# Patient Record
Sex: Female | Born: 1980 | Race: Black or African American | Hispanic: No | Marital: Single | Smoking: Current every day smoker
Health system: Southern US, Community
[De-identification: ages and names within clinical notes are randomized; demographics above are authoritative.]

## PROBLEM LIST (undated history)

## (undated) ENCOUNTER — Inpatient Hospital Stay (HOSPITAL_COMMUNITY): Payer: Self-pay

## (undated) DIAGNOSIS — Z789 Other specified health status: Secondary | ICD-10-CM

## (undated) DIAGNOSIS — L0291 Cutaneous abscess, unspecified: Secondary | ICD-10-CM

## (undated) HISTORY — PX: DILATION AND CURETTAGE OF UTERUS: SHX78

---

## 2004-02-20 ENCOUNTER — Emergency Department (HOSPITAL_COMMUNITY): Admission: EM | Admit: 2004-02-20 | Discharge: 2004-02-20 | Payer: Self-pay

## 2004-08-07 ENCOUNTER — Emergency Department (HOSPITAL_COMMUNITY): Admission: EM | Admit: 2004-08-07 | Discharge: 2004-08-07 | Payer: Self-pay | Admitting: Family Medicine

## 2004-11-04 ENCOUNTER — Emergency Department (HOSPITAL_COMMUNITY): Admission: EM | Admit: 2004-11-04 | Discharge: 2004-11-04 | Payer: Self-pay | Admitting: Family Medicine

## 2005-02-27 ENCOUNTER — Emergency Department (HOSPITAL_COMMUNITY): Admission: EM | Admit: 2005-02-27 | Discharge: 2005-02-27 | Payer: Self-pay | Admitting: Family Medicine

## 2006-01-01 ENCOUNTER — Inpatient Hospital Stay (HOSPITAL_COMMUNITY): Admission: AD | Admit: 2006-01-01 | Discharge: 2006-01-01 | Payer: Self-pay | Admitting: Obstetrics & Gynecology

## 2006-02-14 ENCOUNTER — Inpatient Hospital Stay (HOSPITAL_COMMUNITY): Admission: AD | Admit: 2006-02-14 | Discharge: 2006-02-16 | Payer: Self-pay | Admitting: Obstetrics & Gynecology

## 2006-07-19 ENCOUNTER — Emergency Department (HOSPITAL_COMMUNITY): Admission: EM | Admit: 2006-07-19 | Discharge: 2006-07-19 | Payer: Self-pay | Admitting: Emergency Medicine

## 2007-01-19 ENCOUNTER — Inpatient Hospital Stay (HOSPITAL_COMMUNITY): Admission: AD | Admit: 2007-01-19 | Discharge: 2007-01-19 | Payer: Self-pay | Admitting: Obstetrics and Gynecology

## 2007-08-09 ENCOUNTER — Emergency Department (HOSPITAL_COMMUNITY): Admission: EM | Admit: 2007-08-09 | Discharge: 2007-08-10 | Payer: Self-pay | Admitting: Emergency Medicine

## 2008-08-05 ENCOUNTER — Ambulatory Visit: Payer: Self-pay | Admitting: Physician Assistant

## 2008-08-05 ENCOUNTER — Inpatient Hospital Stay (HOSPITAL_COMMUNITY): Admission: AD | Admit: 2008-08-05 | Discharge: 2008-08-05 | Payer: Self-pay | Admitting: Obstetrics & Gynecology

## 2008-08-08 ENCOUNTER — Inpatient Hospital Stay (HOSPITAL_COMMUNITY): Admission: RE | Admit: 2008-08-08 | Discharge: 2008-08-08 | Payer: Self-pay | Admitting: Obstetrics

## 2008-08-10 ENCOUNTER — Inpatient Hospital Stay (HOSPITAL_COMMUNITY): Admission: AD | Admit: 2008-08-10 | Discharge: 2008-08-10 | Payer: Self-pay | Admitting: Obstetrics

## 2009-12-18 ENCOUNTER — Emergency Department (HOSPITAL_COMMUNITY): Admission: EM | Admit: 2009-12-18 | Discharge: 2009-12-18 | Payer: Self-pay | Admitting: Emergency Medicine

## 2012-02-09 ENCOUNTER — Encounter (HOSPITAL_COMMUNITY): Payer: Self-pay | Admitting: *Deleted

## 2012-02-09 ENCOUNTER — Inpatient Hospital Stay (HOSPITAL_COMMUNITY)
Admission: AD | Admit: 2012-02-09 | Discharge: 2012-02-09 | Disposition: A | Discharge status: Home / Self Care | Payer: Medicaid Other | Source: Ambulatory Visit | Attending: Family Medicine | Admitting: Family Medicine

## 2012-02-09 ENCOUNTER — Inpatient Hospital Stay (HOSPITAL_COMMUNITY): Payer: Medicaid Other

## 2012-02-09 DIAGNOSIS — IMO0002 Reserved for concepts with insufficient information to code with codable children: Secondary | ICD-10-CM | POA: Insufficient documentation

## 2012-02-09 DIAGNOSIS — F17200 Nicotine dependence, unspecified, uncomplicated: Secondary | ICD-10-CM | POA: Insufficient documentation

## 2012-02-09 DIAGNOSIS — R109 Unspecified abdominal pain: Secondary | ICD-10-CM | POA: Insufficient documentation

## 2012-02-09 DIAGNOSIS — Z8742 Personal history of other diseases of the female genital tract: Secondary | ICD-10-CM

## 2012-02-09 DIAGNOSIS — O034 Incomplete spontaneous abortion without complication: Secondary | ICD-10-CM

## 2012-02-09 HISTORY — DX: Other specified health status: Z78.9

## 2012-02-09 LAB — CBC
HCT: 34.4 % — ABNORMAL LOW (ref 36.0–46.0)
MCHC: 33.4 g/dL (ref 30.0–36.0)
RBC: 3.62 MIL/uL — ABNORMAL LOW (ref 3.87–5.11)
RDW: 13.3 % (ref 11.5–15.5)
WBC: 12.4 10*3/uL — ABNORMAL HIGH (ref 4.0–10.5)

## 2012-02-09 LAB — HCG, QUANTITATIVE, PREGNANCY: hCG, Beta Chain, Quant, S: 180 m[IU]/mL — ABNORMAL HIGH (ref <5)

## 2012-02-09 LAB — WET PREP, GENITAL: Clue Cells Wet Prep HPF POC: NONE SEEN

## 2012-02-09 LAB — POCT PREGNANCY, URINE: Preg Test, Ur: POSITIVE — AB

## 2012-02-09 MED ORDER — KETOROLAC TROMETHAMINE 60 MG/2ML IM SOLN
60.0000 mg | Freq: Once | INTRAMUSCULAR | Status: AC
  Administered 2012-02-09: 60 mg via INTRAMUSCULAR
  Filled 2012-02-09: qty 2

## 2012-02-09 MED ORDER — PROMETHAZINE HCL 25 MG PO TABS: 25.0000 mg | ORAL_TABLET | Freq: Four times a day (QID) | ORAL | Status: AC | PRN

## 2012-02-09 MED ORDER — MISOPROSTOL 200 MCG PO TABS: 800.0000 ug | ORAL_TABLET | Freq: Once | ORAL | Status: DC

## 2012-02-09 NOTE — Progress Notes (Signed)
Pt states started bleeding at 0930, noted heavy bleeding, went to restroom and passed large clot teacup saucer sized. Is on 3rd pad since 0930. Cramping at present. Had TAB 02/03/2012, states did not bleed more than one day. Notes swelling in feet bilaterally, left foot feels tingling.

## 2012-02-09 NOTE — ED Provider Notes (Signed)
History     No chief complaint on file.  HPI Laurie Morales 30 y.o. Had TAB on 02-03-12 at [facility] in [facility] (Dr. Okey Dupre).  Is taking birth control pills.  Today awakened with large amount of blood in the bed and had a very large clot when she was on the toilet (lunch plate size)  Called the office but no one called back.  Came to MAU as she is concerned about the large amount of bleeding and cramping.  Has a follow up appointment scheduled on Monday.  Client reports she was told she was 14-[redacted] weeks pregnant.  Last normal menses was 10-21-11.  OB History    Grav Para Term Preterm Abortions TAB SAB Ect Mult Living   9 5 5  0 4 2 2  0 0 5      Past Medical History  Diagnosis Date  . No pertinent past medical history     Past Surgical History  Procedure Date  . Dilation and curettage of uterus     Family History  Problem Relation Age of Onset  . Anesthesia problems Neg Hx     History  Substance Use Topics  . Smoking status: Current Everyday Smoker    Types: Cigarettes  . Smokeless tobacco: Not on file  . Alcohol Use: Yes     socially     Allergies: No Known Allergies  Prescriptions prior to admission  Medication Sig Dispense Refill  . doxycycline (MONODOX) 100 MG capsule Take 100 mg by mouth 2 (two) times daily.      Marland Kitchen ibuprofen (ADVIL,MOTRIN) 200 MG tablet Take 200 mg by mouth every 6 (six) hours as needed. pain      . norethindrone-ethinyl estradiol (JUNEL FE,GILDESS FE,LOESTRIN FE) 1-20 MG-MCG tablet Take 1 tablet by mouth daily.        Review of Systems  Constitutional: Negative for fever.  Gastrointestinal: Positive for abdominal pain. Negative for nausea and vomiting.  Genitourinary:       Vaginal bleeding   Physical Exam   Blood pressure 118/84, pulse 92, temperature 98.5 F (36.9 C), temperature source Oral, resp. rate 16, height 5\' 4"  (1.626 m), weight 175 lb 2 oz (79.436 kg), last menstrual period 02/03/2012.  Physical Exam  Nursing note and  vitals reviewed. Constitutional: She is oriented to person, place, and time. She appears well-developed and well-nourished.  HENT:  Head: Normocephalic.  Eyes: EOM are normal.  Neck: Neck supple.  GI: Soft. There is no tenderness. There is no rebound and no guarding.  Genitourinary:       Speculum exam: Vulva - dark red blood noted Vagina - Dark blood, no odor Cervix - No contact bleeding Bimanual exam: Cervix open 1 cm Uterus tender, 6 week size Adnexa non tender, no masses bilaterally GC/Chlam, wet prep done Chaperone present for exam.  Musculoskeletal: Normal range of motion.  Neurological: She is alert and oriented to person, place, and time.  Skin: Skin is warm and dry.  Psychiatric: She has a normal mood and affect.   Toradol 60 mg IM for pain in MAU  MAU Course  Procedures  MDM Clinical Data: Heavy vaginal bleeding and pelvic pain and cramping.  1 week status post therapeutic abortion.  TRANSABDOMINAL AND TRANSVAGINAL ULTRASOUND OF PELVIS  Technique: Both transabdominal and transvaginal ultrasound  examinations of the pelvis were performed. Transabdominal  technique was performed for global imaging of the pelvis including  uterus, ovaries, adnexal regions, and pelvic cul-de-sac.  It was necessary to  proceed with endovaginal exam following the  transabdominal exam to visualize the endometrium and ovaries.  Comparison: None.  Findings:  Uterus: 11.0 x 6.2 x 7.8 cm. No fibroids identified.  Endometrium: A rounded mass-like area of heterogeneous increased  echogenicity is seen, a portion of which contains internal blood  flow. This measures approximately 4.3 x 3.1 by 3.4 cm, and is  consistent with retained products of conception.  Right ovary: Normal appearance/no adnexal mass  Left ovary: Normal appearance/no adnexal mass  Other Findings: No free fluid  IMPRESSION:  1. 4 cm soft tissue mass in the central endometrial cavity,  consistent with retained products of  conception.  2. Normal ovaries. No evidence of adnexal mass or free fluid.   1500 consult with Dr. Shawnie Pons re: exam, labs, ultrasound results and plan of care  Assessment and Plan  Post TAB - probable retained products and heavy bleeding  Plan RX cytotec 800 mcg per rectum single dose RX ibuprofen 600 mg po q6h prn for pain rx phenergan 25 mg po q 4 h prn for nausea Keep appointment as scheduled for TAB follow up Return if having severe bleeding or severe pain.  Laurie Morales 02/09/2012, 12:38 PM   Nolene Bernheim, NP 02/09/12 1512

## 2012-02-09 NOTE — Discharge Instructions (Signed)
Get your prescriptions filled.  Insert the cytotec all 4 tablets at once into the rectum at bedtime.  If you have problems with bleeding or fever, you need to go to [facility] so your doctor can be notified.

## 2012-02-09 NOTE — Progress Notes (Signed)
Had a TAB on 02/03/12.  Was taking birth control pills to regulate bleeding and states bleeding was okay until this morning.  States she woke up in a puddle of blood, states it was running down legs.  Went to bathroom and passed saucer sized clot.  Has continued to pass golf ball sized clots.  Is now on pad #3 since 0930.  Reports cramping since 0930.

## 2012-02-09 NOTE — ED Provider Notes (Signed)
Chart reviewed and agree with management and plan.  

## 2012-07-17 ENCOUNTER — Encounter (HOSPITAL_COMMUNITY): Payer: Self-pay | Admitting: *Deleted

## 2012-07-17 ENCOUNTER — Emergency Department (HOSPITAL_COMMUNITY)
Admission: EM | Admit: 2012-07-17 | Discharge: 2012-07-17 | Disposition: A | Discharge status: Home / Self Care | Payer: Medicaid Other | Attending: Emergency Medicine | Admitting: Emergency Medicine

## 2012-07-17 DIAGNOSIS — F17200 Nicotine dependence, unspecified, uncomplicated: Secondary | ICD-10-CM | POA: Insufficient documentation

## 2012-07-17 DIAGNOSIS — L0390 Cellulitis, unspecified: Secondary | ICD-10-CM

## 2012-07-17 DIAGNOSIS — N61 Mastitis without abscess: Secondary | ICD-10-CM | POA: Insufficient documentation

## 2012-07-17 MED ORDER — DIPHENHYDRAMINE HCL 50 MG/ML IJ SOLN
INTRAMUSCULAR | Status: AC
  Filled 2012-07-17: qty 1

## 2012-07-17 MED ORDER — DOXYCYCLINE HYCLATE 100 MG PO CAPS: 100.0000 mg | ORAL_CAPSULE | Freq: Two times a day (BID) | ORAL | Status: AC

## 2012-07-17 MED ORDER — VANCOMYCIN HCL IN DEXTROSE 1-5 GM/200ML-% IV SOLN
1000.0000 mg | Freq: Once | INTRAVENOUS | Status: AC
  Administered 2012-07-17: 1000 mg via INTRAVENOUS
  Filled 2012-07-17: qty 200

## 2012-07-17 MED ORDER — DIPHENHYDRAMINE HCL 50 MG/ML IJ SOLN
25.0000 mg | Freq: Once | INTRAMUSCULAR | Status: AC
  Administered 2012-07-17: 25 mg via INTRAVENOUS

## 2012-07-17 MED ORDER — HYDROCODONE-ACETAMINOPHEN 5-325 MG PO TABS: 1.0000 | ORAL_TABLET | Freq: Four times a day (QID) | ORAL | Status: AC | PRN

## 2012-07-17 NOTE — ED Notes (Signed)
Spoke with case management re: assistance with Rx, awaiting return call

## 2012-07-17 NOTE — ED Provider Notes (Signed)
History   This chart was scribed for Benny Lennert, MD by Sofie Rower. The patient was seen in room TR09C/TR09C and the patient's care was started at 12:28 PM     CSN: [id number]  Arrival date & time 07/17/12  1046   None     Chief Complaint  Patient presents with  . Abscess    (Consider location/radiation/quality/duration/timing/severity/associated sxs/prior treatment) Patient is a 31 y.o. female presenting with abscess. The history is provided by the patient. No language interpreter was used.  Abscess  This is a new problem. The current episode started more than one week ago. The onset was gradual. The problem occurs rarely. The problem has been gradually worsening. The abscess is present on the torso (right breast). The problem is moderate. The abscess is characterized by swelling and painfulness. It is unknown what she was exposed to. The abscess first occurred at home. Pertinent negatives include no fever, no vomiting and no cough.     Past Medical History  Diagnosis Date  . No pertinent past medical history     Past Surgical History  Procedure Date  . Dilation and curettage of uterus     Family History  Problem Relation Age of Onset  . Anesthesia problems Neg Hx     History  Substance Use Topics  . Smoking status: Current Everyday Smoker    Types: Cigarettes  . Smokeless tobacco: Not on file  . Alcohol Use: Yes     socially     OB History    Grav Para Term Preterm Abortions TAB SAB Ect Mult Living   9 5 5  0 4 2 2  0 0 5      Review of Systems  Constitutional: Negative for fever.  Respiratory: Negative for cough.   Gastrointestinal: Negative for vomiting.  All other systems reviewed and are negative.    Allergies  Review of patient's allergies indicates no known allergies.  Home Medications   Current Outpatient Rx  Name Route Sig Dispense Refill  . ACETAMINOPHEN 500 MG PO TABS Oral Take 750 mg by mouth every 6 (six) hours as needed. As needed  for pain.    . IBUPROFEN 200 MG PO TABS Oral Take 600 mg by mouth every 6 (six) hours as needed. pain      BP 134/87  Pulse 103  Temp 98.1 F (36.7 C) (Oral)  Resp 16  SpO2 99%  Physical Exam  Nursing note and vitals reviewed. Constitutional: She is oriented to person, place, and time. She appears well-developed.  HENT:  Head: Normocephalic.  Eyes: Conjunctivae are normal.  Neck: No tracheal deviation present.  Cardiovascular:  No murmur heard. Musculoskeletal: Normal range of motion.       Abscess located at the right breast, swollen, tender, red around areola, cellulitis outside the areola.   Neurological: She is oriented to person, place, and time.  Skin: Skin is warm.  Psychiatric: She has a normal mood and affect.    ED Course  Procedures (including critical care time)  DIAGNOSTIC STUDIES: Oxygen Saturation is 99% on room air, normal by my interpretation.    COORDINATION OF CARE:    12:35AM- EDP at bedside discusses treatment plan concerning possible I and D procedure.   Labs Reviewed - No data to display No results found.   No diagnosis found.    MDM        The chart was scribed for me under my direct supervision.  I personally performed the history, physical,  and medical decision making and all procedures in the evaluation of this patient.Benny Lennert, MD 07/17/12 1322

## 2012-07-17 NOTE — Progress Notes (Signed)
Consulted by attending nurse to obtain medication assistance for patient. Although she does show Medicaid as active, patient has not received cards for eligibility and cannot afford the antibiotic. Per pharmacy, patient is eligible for the [employer]. I approved the script, picked up the medication and delivered to the attending nurse for her to disburse.

## 2012-07-17 NOTE — ED Notes (Signed)
Pt was provided antibiotic Rx to take home, pt left without receiving her Rx, pt contacted at home & pt is to come to the ED & speak with the nurse first to receive her medication

## 2012-07-17 NOTE — ED Notes (Signed)
Pt with what appears to be abscess to right breast, reports pain, swelling, and warmth to area.

## 2012-07-17 NOTE — ED Notes (Signed)
Rt breast is swollen and red tender to touch states it feel better now after shower. States she stopped breast feeding 6 years ago but milk never dried up till like a month ago

## 2012-07-18 ENCOUNTER — Ambulatory Visit (INDEPENDENT_AMBULATORY_CARE_PROVIDER_SITE_OTHER): Payer: Self-pay | Admitting: Surgery

## 2012-07-18 ENCOUNTER — Encounter (INDEPENDENT_AMBULATORY_CARE_PROVIDER_SITE_OTHER): Payer: Self-pay | Admitting: Surgery

## 2012-07-18 ENCOUNTER — Other Ambulatory Visit (INDEPENDENT_AMBULATORY_CARE_PROVIDER_SITE_OTHER): Payer: Self-pay | Admitting: Surgery

## 2012-07-18 VITALS — BP 114/78 | HR 60 | Temp 97.4°F | Resp 12 | Ht 64.0 in | Wt 170.8 lb

## 2012-07-18 DIAGNOSIS — N611 Abscess of the breast and nipple: Secondary | ICD-10-CM | POA: Insufficient documentation

## 2012-07-18 DIAGNOSIS — Z720 Tobacco use: Secondary | ICD-10-CM

## 2012-07-18 DIAGNOSIS — N61 Mastitis without abscess: Secondary | ICD-10-CM

## 2012-07-18 NOTE — Patient Instructions (Addendum)
WOUND CARE  It is important that the wound be kept open.   -Keeping the skin edges apart will allow the wound to gradually heal from the base upwards.   - If the skin edges of the wound close too early, a new fluid pocket can form and infection can occur. -This is the reason to pack deeper wounds with gauze or ribbon -This is why drained wounds cannot be sewed closed right away  A healthy wound should form a lining of bright red "beefy" granulating tissue that will help shrink the wound and help the edges grow new skin into it.   -A little mucus / yellow discharge is normal (the body's natural way to try and form a scab) and should be gently washed off with soap and water with daily dressing changes.  -Green or foul smelling drainage implies bacterial colonization and can slow wound healing - a short course of antibiotic ointment (3-5 days) can help it clear up.  Call the doctor if it does not improve or worsens  -Avoid use of antibiotic ointments for more than a week as they can slow wound healing over time.    -Sometimes other wound care products will be used to reduce need for dressing changes and/or help clean up dirty wounds -Sometimes the surgeon needs to debride the wound in the office to remove dead or infected tissue out of the wound so it can heal more quickly and safely.    Change the dressing at least once a day -Wash the wound with mild soap and water gently every day.  It is good to shower or bathe the wound to help it clean out. -Use clean 4x4 gauze for medium/large wounds or ribbon plain NU-gauze for smaller wounds (it does not need to be sterile, just clean) -Keep the raw wound moist with a little saline or KY (saline) gel on the gauze.  -A dry wound will take longer to heal.  -Keep the skin dry around the wound to prevent breakdown and irritation. -Pack the wound down to the base -The goal is to keep the skin apart, not overpack the wound -Use a Q-tip or blunt-tipped kabob  stick toothpick to push the gauze down to the base in narrow or deep wounds   -Cover with a clean gauze and tape -paper or Medipore tape tend to be gentle on the skin -rotate the orientation of the tape to avoid repeated stress/trauma on the skin -using an ACE or Coban wrap on wounds on arms or legs can be used instead.  Managing Pain  Pain after surgery or related to activity is often due to strain/injury to muscle, tendon, nerves and/or incisions.  This pain is usually short-term and will improve in a few months.   Many people find it helpful to do the following things TOGETHER to help speed the process of healing and to get back to regular activity more quickly:  1. Avoid heavy physical activity a.  no lifting greater than 20 pounds b. Do not "push through" the pain.  Listen to your body and avoid positions and maneuvers than reproduce the pain c. Walking is okay as tolerated, but go slowly and stop when getting sore.  d. Remember: If it hurts to do it, then don't do it! 2. Take Anti-inflammatory medication  a. Take with food/snack around the clock for 1-2 weeks i. This helps the muscle and nerve tissues become less irritable and calm down faster b. Choose ONE of the following over-the-counter  medications: i. Naproxen 220mg  tabs (ex. Aleve) 1-2 pills twice a day  ii. Ibuprofen 200mg  tabs (ex. Advil, Motrin) 3-4 pills with every meal and just before bedtime iii. Acetaminophen 500mg  tabs (Tylenol) 1-2 pills with every meal and just before bedtime 3. Use a Heating pad or Ice/Cold Pack a. 4-6 times a day b. May use warm bath/hottub  or showers 4. Try Gentle Massage and/or Stretching  a. at the area of pain many times a day b. stop if you feel pain - do not overdo it  Try these steps together to help you body heal faster and avoid making things get worse.  Doing just one of these things may not be enough.    If you are not getting better after two weeks or are noticing you are getting  worse, contact our office for further advice; we may need to re-evaluate you & see what other things we can do to help  Complete all antibiotics through the entire prescription to help the infection heal and prevent new places of infection   Returning the see the surgeon is helpful to follow the healing process and help the wound close as fast as possible.  Managing Pain  Pain after surgery or related to activity is often due to strain/injury to muscle, tendon, nerves and/or incisions.  This pain is usually short-term and will improve in a few months.   Many people find it helpful to do the following things TOGETHER to help speed the process of healing and to get back to regular activity more quickly:  5. Avoid heavy physical activity a.  no lifting greater than 20 pounds b. Do not "push through" the pain.  Listen to your body and avoid positions and maneuvers than reproduce the pain c. Walking is okay as tolerated, but go slowly and stop when getting sore.  d. Remember: If it hurts to do it, then don't do it! 6. Take Anti-inflammatory medication  a. Take with food/snack around the clock for 1-2 weeks i. This helps the muscle and nerve tissues become less irritable and calm down faster b. Choose ONE of the following over-the-counter medications: i. Naproxen 220mg  tabs (ex. Aleve) 1-2 pills twice a day  ii. Ibuprofen 200mg  tabs (ex. Advil, Motrin) 3-4 pills with every meal and just before bedtime iii. Acetaminophen 500mg  tabs (Tylenol) 1-2 pills with every meal and just before bedtime 7. Use a Heating pad or Ice/Cold Pack a. 4-6 times a day b. May use warm bath/hottub  or showers 8. Try Gentle Massage and/or Stretching  a. at the area of pain many times a day b. stop if you feel pain - do not overdo it  Try these steps together to help you body heal faster and avoid making things get worse.  Doing just one of these things may not be enough.    If you are not getting better after two  weeks or are noticing you are getting worse, contact our office for further advice; we may need to re-evaluate you & see what other things we can do to help.  Smoking Cessation This document explains the best ways for you to quit smoking and new treatments to help. It lists new medicines that can double or triple your chances of quitting and quitting for good. It also considers ways to avoid relapses and concerns you may have about quitting, including weight gain. NICOTINE: A POWERFUL ADDICTION If you have tried to quit smoking, you know how hard it can be.  It is hard because nicotine is a very addictive drug. For some people, it can be as addictive as heroin or cocaine. Usually, people make 2 or 3 tries, or more, before finally being able to quit. Each time you try to quit, you can learn about what helps and what hurts. Quitting takes hard work and a lot of effort, but you can quit smoking. QUITTING SMOKING IS ONE OF THE MOST IMPORTANT THINGS YOU WILL EVER DO.  You will live longer, feel better, and live better.   The impact on your body of quitting smoking is felt almost immediately:   Within 20 minutes, blood pressure decreases. Pulse returns to its normal level.   After 8 hours, carbon monoxide levels in the blood return to normal. Oxygen level increases.   After 24 hours, chance of heart attack starts to decrease. Breath, hair, and body stop smelling like smoke.   After 48 hours, damaged nerve endings begin to recover. Sense of taste and smell improve.   After 72 hours, the body is virtually free of nicotine. Bronchial tubes relax and breathing becomes easier.   After 2 to 12 weeks, lungs can hold more air. Exercise becomes easier and circulation improves.   Quitting will reduce your risk of having a heart attack, stroke, cancer, or lung disease:   After 1 year, the risk of coronary heart disease is cut in half.   After 5 years, the risk of stroke falls to the same as a nonsmoker.     After 10 years, the risk of lung cancer is cut in half and the risk of other cancers decreases significantly.   After 15 years, the risk of coronary heart disease drops, usually to the level of a nonsmoker.   If you are pregnant, quitting smoking will improve your chances of having a healthy baby.   The people you live with, especially your children, will be healthier.   You will have extra money to spend on things other than cigarettes.  FIVE KEYS TO QUITTING Studies have shown that these 5 steps will help you quit smoking and quit for good. You have the best chances of quitting if you use them together: 1. Get ready.  2. Get support and encouragement.  3. Learn new skills and behaviors.  4. Get medicine to reduce your nicotine addiction and use it correctly.  5. Be prepared for relapse or difficult situations. Be determined to continue trying to quit, even if you do not succeed at first.  1. GET READY  Set a quit date.   Change your environment.   Get rid of ALL cigarettes, ashtrays, matches, and lighters in your home, car, and place of work.   Do not let people smoke in your home.   Review your past attempts to quit. Think about what worked and what did not.   Once you quit, do not smoke. NOT EVEN A PUFF!  2. GET SUPPORT AND ENCOURAGEMENT Studies have shown that you have a better chance of being successful if you have help. You can get support in many ways.  Tell your family, friends, and coworkers that you are going to quit and need their support. Ask them not to smoke around you.   Talk to your caregivers (doctor, dentist, nurse, pharmacist, psychologist, and/or smoking counselor).   Get individual, group, or telephone counseling and support. The more counseling you have, the better your chances are of quitting. Programs are available at [employer] and health centers. Call your  local health department for information about programs in your area.   Spiritual beliefs  and practices may help some smokers quit.   Quit meters are Photographer that keep track of quit statistics, such as amount of "quit-time," cigarettes not smoked, and money saved.   Many smokers find one or more of the many self-help books available useful in helping them quit and stay off tobacco.  3. LEARN NEW SKILLS AND BEHAVIORS  Try to distract yourself from urges to smoke. Talk to someone, go for a walk, or occupy your time with a task.   When you first try to quit, change your routine. Take a different route to work. Drink tea instead of coffee. Eat breakfast in a different place.   Do something to reduce your stress. Take a hot bath, exercise, or read a book.   Plan something enjoyable to do every day. Reward yourself for not smoking.   Explore interactive web-based programs that specialize in helping you quit.  4. GET MEDICINE AND USE IT CORRECTLY Medicines can help you stop smoking and decrease the urge to smoke. Combining medicine with the above behavioral methods and support can quadruple your chances of successfully quitting smoking. The [place] Food and Drug Administration (FDA) has approved 7 medicines to help you quit smoking. These medicines fall into 3 categories.  Nicotine replacement therapy (delivers nicotine to your body without the negative effects and risks of smoking):   Nicotine gum: Available over-the-counter.   Nicotine lozenges: Available over-the-counter.   Nicotine inhaler: Available by prescription.   Nicotine nasal spray: Available by prescription.   Nicotine skin patches (transdermal): Available by prescription and over-the-counter.   Antidepressant medicine (helps people abstain from smoking, but how this works is unknown):   Bupropion sustained-release (SR) tablets: Available by prescription.   Nicotinic receptor partial agonist (simulates the effect of nicotine in your brain):   Varenicline tartrate tablets:  Available by prescription.   Ask your caregiver for advice about which medicines to use and how to use them. Carefully read the information on the package.   Everyone who is trying to quit may benefit from using a medicine. If you are pregnant or trying to become pregnant, nursing an infant, you are under age 80, or you smoke fewer than 10 cigarettes per day, talk to your caregiver before taking any nicotine replacement medicines.   You should stop using a nicotine replacement product and call your caregiver if you experience nausea, dizziness, weakness, vomiting, fast or irregular heartbeat, mouth problems with the lozenge or gum, or redness or swelling of the skin around the patch that does not go away.   Do not use any other product containing nicotine while using a nicotine replacement product.   Talk to your caregiver before using these products if you have diabetes, heart disease, asthma, stomach ulcers, you had a recent heart attack, you have high blood pressure that is not controlled with medicine, a history of irregular heartbeat, or you have been prescribed medicine to help you quit smoking.  5. BE PREPARED FOR RELAPSE OR DIFFICULT SITUATIONS  Most relapses occur within the first 3 months after quitting. Do not be discouraged if you start smoking again. Remember, most people try several times before they finally quit.   You may have symptoms of withdrawal because your body is used to nicotine. You may crave cigarettes, be irritable, feel very hungry, cough often, get headaches, or have difficulty concentrating.   The withdrawal symptoms  are only temporary. They are strongest when you first quit, but they will go away within 10 to 14 days.  Here are some difficult situations to watch for:  Alcohol. Avoid drinking alcohol. Drinking lowers your chances of successfully quitting.   Caffeine. Try to reduce the amount of caffeine you consume. It also lowers your chances of successfully  quitting.   Other smokers. Being around smoking can make you want to smoke. Avoid smokers.   Weight gain. Many smokers will gain weight when they quit, usually less than 10 pounds. Eat a healthy diet and stay active. Do not let weight gain distract you from your main goal, quitting smoking. Some medicines that help you quit smoking may also help delay weight gain. You can always lose the weight gained after you quit.   Bad mood or depression. There are a lot of ways to improve your mood other than smoking.  If you are having problems with any of these situations, talk to your caregiver. SPECIAL SITUATIONS AND CONDITIONS Studies suggest that everyone can quit smoking. Your situation or condition can give you a special reason to quit.  Pregnant women/new mothers: By quitting, you protect your baby's health and your own.   Hospitalized patients: By quitting, you reduce health problems and help healing.   Heart attack patients: By quitting, you reduce your risk of a second heart attack.   Lung, head, and neck cancer patients: By quitting, you reduce your chance of a second cancer.   Parents of children and adolescents: By quitting, you protect your children from illnesses caused by secondhand smoke.  QUESTIONS TO THINK ABOUT Think about the following questions before you try to stop smoking. You may want to talk about your answers with your caregiver.  Why do you want to quit?   If you tried to quit in the past, what helped and what did not?   What will be the most difficult situations for you after you quit? How will you plan to handle them?   Who can help you through the tough times? Your family? Friends? Caregiver?   What pleasures do you get from smoking? What ways can you still get pleasure if you quit?  Here are some questions to ask your caregiver:  How can you help me to be successful at quitting?   What medicine do you think would be best for me and how should I take it?    What should I do if I need more help?   What is smoking withdrawal like? How can I get information on withdrawal?  Quitting takes hard work and a lot of effort, but you can quit smoking. FOR MORE INFORMATION  Smokefree.gov (http://www.davis-sullivan.com/) provides free, accurate, evidence-based information and professional assistance to help support the immediate and long-term needs of people trying to quit smoking. Document Released: 11/30/2001 Document Revised: 11/25/2011 Document Reviewed: 09/22/2009 [facility] Patient Information 2012 [place], [place].

## 2012-07-18 NOTE — Progress Notes (Signed)
Subjective:     Patient ID: Laurie Morales, female   DOB: [birth year], 31 y.o.   MRN: [id number]  HPI  Laurie Morales  February 17, 1981 [id number]  Patient has no care team.  This patient is a 31 y.o.female who presents today for surgical evaluation at the request of Dr. Aileen Pilot, [facility].   Reason for visit: Painful right breast, probable abscess.  Patient is a pleasant smoking female.  She noted right breast pain and tenderness  Five days ago.  It has progressively worsened.  She has tried to do heat and pressure to help him express.  She had a milk duct that was inflamed about six years ago.  Better with compresses and pressure.  This time it is not getting better.  She is concerned.  She went to the emergency room.  She has no insurance nor any money.  I recommended antibiotics and followup with surgery.  She comes in today for evaluation.  She comes today with her mother.  She recalls having an abscess drained around her buttock in the past.  It does not recall history of MRSA.  Patient Active Problem List  Diagnosis  . Abscess of right breast  . Tobacco abuse    Past Medical History  Diagnosis Date  . No pertinent past medical history     Past Surgical History  Procedure Date  . Dilation and curettage of uterus     History   Social History  . Marital Status: Single    Spouse Name: N/A    Number of Children: N/A  . Years of Education: N/A   Occupational History  . Not on file.   Social History Main Topics  . Smoking status: Current Everyday Smoker -- 0.3 packs/day    Types: Cigarettes  . Smokeless tobacco: Not on file  . Alcohol Use: Yes     socially   . Drug Use: No  . Sexually Active: Yes    Birth Control/ Protection: None     currently on bcp s/p TAB   Other Topics Concern  . Not on file   Social History Narrative  . No narrative on file    Family History  Problem Relation Age of Onset  . Anesthesia problems Neg Hx   . Diabetes Mother     . Asthma Daughter   . Diabetes Maternal Grandmother   . Stroke Maternal Grandmother   . Heart disease Maternal Grandmother   . Hearing loss Maternal Grandmother     Current Outpatient Prescriptions  Medication Sig Dispense Refill  . acetaminophen (TYLENOL) 500 MG tablet Take 750 mg by mouth every 6 (six) hours as needed. As needed for pain.      Marland Kitchen doxycycline (VIBRAMYCIN) 100 MG capsule Take 1 capsule (100 mg total) by mouth 2 (two) times daily.  20 capsule  0  . HYDROcodone-acetaminophen (NORCO/VICODIN) 5-325 MG per tablet Take 1 tablet by mouth every 6 (six) hours as needed for pain.  20 tablet  0  . ibuprofen (ADVIL,MOTRIN) 200 MG tablet Take 600 mg by mouth every 6 (six) hours as needed. pain       No current facility-administered medications for this visit.     No Known Allergies  BP 114/78  Pulse 60  Temp 97.4 F (36.3 C) (Temporal)  Resp 12  Ht 5\' 4"  (1.626 m)  Wt 170 lb 12.8 oz (77.474 kg)  BMI 29.32 kg/m2  LMP 06/18/2012  No results found.   Review of Systems  Constitutional: Negative for fever, chills and diaphoresis.  HENT: Negative for ear pain, sore throat and trouble swallowing.   Eyes: Negative for photophobia and visual disturbance.  Respiratory: Negative for cough and choking.   Cardiovascular: Negative for chest pain and palpitations.  Gastrointestinal: Negative for nausea, vomiting, abdominal pain, diarrhea, constipation, anal bleeding and rectal pain.  Genitourinary: Negative for dysuria, frequency and difficulty urinating.  Musculoskeletal: Negative for myalgias and gait problem.  Skin: Negative for color change, pallor and rash.  Neurological: Negative for dizziness, speech difficulty, weakness and numbness.  Hematological: Negative for adenopathy.  Psychiatric/Behavioral: Negative for confusion and agitation. The patient is not nervous/anxious.        Objective:   Physical Exam  Constitutional: She is oriented to person, place, and time. She  appears well-developed and well-nourished. No distress.  HENT:  Head: Normocephalic.  Mouth/Throat: Oropharynx is clear and moist. No oropharyngeal exudate.  Eyes: Conjunctivae and EOM are normal. Pupils are equal, round, and reactive to light. No scleral icterus.  Neck: Normal range of motion. No tracheal deviation present.  Cardiovascular: Normal rate and intact distal pulses.   Pulmonary/Chest: Effort normal. No respiratory distress. She exhibits no tenderness. Right breast exhibits tenderness. Right breast exhibits no inverted nipple and no nipple discharge. Left breast exhibits no inverted nipple, no mass, no nipple discharge, no skin change and no tenderness. Breasts are asymmetrical.    Abdominal: Soft. She exhibits no distension. There is no tenderness. Hernia confirmed negative in the right inguinal area and confirmed negative in the left inguinal area.  Genitourinary: No vaginal discharge found.  Musculoskeletal: Normal range of motion. She exhibits no tenderness.  Lymphadenopathy:       Right: No inguinal adenopathy present.       Left: No inguinal adenopathy present.  Neurological: She is alert and oriented to person, place, and time. No cranial nerve deficit. She exhibits normal muscle tone. Coordination normal.  Skin: Skin is warm and dry. No rash noted. She is not diaphoretic.  Psychiatric: She has a normal mood and affect. Her behavior is normal.       Assessment:     Right breast cellulitis w prob central abscess    Plan:     I discussed with the patient and her mother several options.  At the very least, she needs to stop smoking.  I recommended warm compresses over the breast.  Anti-inflammatory such as ibuprofen around the clock as well.  They have a prescription for Vicodin but did not know if they could afford it.  I noted I do not have any samples.  Because of the marked erythema and and probable fluctuance, I recommended incision and drainage.  She  agreed:  The pathophysiology of subcutaneous abscess and differential diagnosis was discussed.  Natural history progression was discussed.  The patient's symptoms are not adequately controlled.  Non-operative treatment has not healed the abscess.  Therefore, I recommended incision & drainage of the abscess to allow the infection to resolve and heal.  Technique, risks, benefits, alternatives discussed.  The patient expressed understanding & wished to proceed.  I placed a field block with local anaesthetic.  I used an 18-gauge needle and was able to I aspirated several cc of frank pus on the upper inner aspect of the area looked.  I made a curvilinear incision around the area lower border in the upper inner quadrant x15 mm.  I encountered a deeper pocket that went towards the nipple.  I evacuated some coagulum &  pus.  I packed it with quarter inch iodoform gauze.  She tolerated procedure relatively well.I incised the skin over the abscess to release the infection.  I excised skin at the wound to have an adequate opening for drainage & prevent skin reclosure.  I packed the wound with ribbon NU-Gauze.    The patient tolerated the procedure.    We will have the patient return to clinic for close follow up to make sure the infection heals.

## 2012-07-21 ENCOUNTER — Ambulatory Visit (INDEPENDENT_AMBULATORY_CARE_PROVIDER_SITE_OTHER): Payer: Self-pay | Admitting: General Surgery

## 2012-07-21 DIAGNOSIS — Z4800 Encounter for change or removal of nonsurgical wound dressing: Secondary | ICD-10-CM

## 2012-07-21 LAB — WOUND CULTURE

## 2012-07-21 NOTE — Progress Notes (Signed)
Patient comes in status post I and D of right breast abscess. Area was packing with 1/4 inch iodoform and covered with dry gauze. I unpacked wound. It looks good. No odor or excessive drainage. Area was cleaned. She did have a small amount of skin breakdown right at the incision site. She was instructed to change outside gauze often to prevent any moistened gauze from staying on her skin too long. She did have some redness on breast but this area is smaller per patient than yesterday. She has been marking her skin with a pen. Area was repacked with 1/4 inch iodoform and recovered with dry gauze. Patient instructed to leave packing in place per Dr Michaell Cowing and change outside dressing as needed. She will call if the redness on her skin gets worse and keep appt next week.

## 2012-07-21 NOTE — Patient Instructions (Addendum)
Keep area packed until your appt next week. You can change the outside dressing. Keep an eye on the redness. If it gets worse call us. [phone].

## 2012-07-25 ENCOUNTER — Ambulatory Visit (INDEPENDENT_AMBULATORY_CARE_PROVIDER_SITE_OTHER): Payer: Self-pay | Admitting: General Surgery

## 2012-07-25 ENCOUNTER — Encounter (INDEPENDENT_AMBULATORY_CARE_PROVIDER_SITE_OTHER): Payer: Self-pay | Admitting: General Surgery

## 2012-07-25 VITALS — BP 126/80 | HR 72 | Resp 18 | Ht 63.0 in | Wt 170.0 lb

## 2012-07-25 DIAGNOSIS — N611 Abscess of the breast and nipple: Secondary | ICD-10-CM

## 2012-07-25 NOTE — Progress Notes (Signed)
Subjective:     Patient ID: Laurie Morales, female   DOB: 1981/08/20, 31 y.o.   MRN: [id number]  HPI 58 yof s/p right breast abscess I/D by Dr. Michaell Cowing last week.  She is doing well without complaint.  Feels much better  Review of Systems     Objective:   Physical Exam right breast incision without erythema, open, I removed some debris that looked like sebaceous cyst material, removed packing    Assessment:     Right breast abscess    Plan:     I removed packing, will heal by secondary intention now, will see back in 2 weeks, stop smoking

## 2012-08-11 ENCOUNTER — Encounter (INDEPENDENT_AMBULATORY_CARE_PROVIDER_SITE_OTHER): Payer: Self-pay | Admitting: General Surgery

## 2012-09-18 ENCOUNTER — Encounter (INDEPENDENT_AMBULATORY_CARE_PROVIDER_SITE_OTHER): Payer: Self-pay | Admitting: General Surgery

## 2012-09-21 ENCOUNTER — Encounter (INDEPENDENT_AMBULATORY_CARE_PROVIDER_SITE_OTHER): Payer: Self-pay | Admitting: General Surgery

## 2012-12-20 NOTE — L&D Delivery Note (Signed)
Delivery Note At 5:58 AM a viable female was delivered via Vaginal, Spontaneous Delivery (Presentation: Right Occiput Anterior).  APGAR: 8, 9; weight pending.   Placenta status: Intact, Spontaneous.  Cord: 3 vessels with the following complications: None.  Cord pH: NA  Anesthesia: None  Episiotomy: None Lacerations: None Suture Repair: NA Est. Blood Loss (mL): 300  Mom to postpartum.   Baby to nursery-stable. Placenta to: [facility]  [place], [place] 08/16/2013, 6:47 AM

## 2013-01-15 ENCOUNTER — Inpatient Hospital Stay (HOSPITAL_COMMUNITY): Payer: Medicaid Other

## 2013-01-15 ENCOUNTER — Encounter (HOSPITAL_COMMUNITY): Payer: Self-pay | Admitting: *Deleted

## 2013-01-15 ENCOUNTER — Inpatient Hospital Stay (HOSPITAL_COMMUNITY)
Admission: AD | Admit: 2013-01-15 | Discharge: 2013-01-15 | Disposition: A | Discharge status: Home / Self Care | Payer: Medicaid Other | Source: Ambulatory Visit | Attending: Obstetrics & Gynecology | Admitting: Obstetrics & Gynecology

## 2013-01-15 DIAGNOSIS — A499 Bacterial infection, unspecified: Secondary | ICD-10-CM | POA: Insufficient documentation

## 2013-01-15 DIAGNOSIS — O418X90 Other specified disorders of amniotic fluid and membranes, unspecified trimester, not applicable or unspecified: Secondary | ICD-10-CM

## 2013-01-15 DIAGNOSIS — R109 Unspecified abdominal pain: Secondary | ICD-10-CM | POA: Insufficient documentation

## 2013-01-15 DIAGNOSIS — N760 Acute vaginitis: Secondary | ICD-10-CM | POA: Insufficient documentation

## 2013-01-15 DIAGNOSIS — B9689 Other specified bacterial agents as the cause of diseases classified elsewhere: Secondary | ICD-10-CM | POA: Insufficient documentation

## 2013-01-15 DIAGNOSIS — O468X9 Other antepartum hemorrhage, unspecified trimester: Secondary | ICD-10-CM

## 2013-01-15 DIAGNOSIS — O2390 Unspecified genitourinary tract infection in pregnancy, unspecified trimester: Secondary | ICD-10-CM | POA: Insufficient documentation

## 2013-01-15 DIAGNOSIS — O209 Hemorrhage in early pregnancy, unspecified: Secondary | ICD-10-CM | POA: Insufficient documentation

## 2013-01-15 LAB — URINALYSIS, ROUTINE W REFLEX MICROSCOPIC
Glucose, UA: NEGATIVE mg/dL
Ketones, ur: NEGATIVE mg/dL
pH: 5.5 (ref 5.0–8.0)

## 2013-01-15 LAB — URINE MICROSCOPIC-ADD ON

## 2013-01-15 LAB — POCT PREGNANCY, URINE: Preg Test, Ur: POSITIVE — AB

## 2013-01-15 LAB — CBC
Hemoglobin: 12.8 g/dL (ref 12.0–15.0)
MCH: 32.8 pg (ref 26.0–34.0)
RBC: 3.9 MIL/uL (ref 3.87–5.11)

## 2013-01-15 LAB — HCG, QUANTITATIVE, PREGNANCY: hCG, Beta Chain, Quant, S: 45331 m[IU]/mL — ABNORMAL HIGH (ref <5)

## 2013-01-15 MED ORDER — METRONIDAZOLE 500 MG PO TABS: 500.0000 mg | ORAL_TABLET | Freq: Two times a day (BID) | ORAL | Status: DC

## 2013-01-15 NOTE — MAU Provider Note (Signed)
History     CSN: [id number]  Arrival date and time: 01/15/13 1052   First Provider Initiated Contact with Patient 01/15/13 1150      Chief Complaint  Patient presents with  . Vaginal Bleeding  . Abdominal Pain   HPI  Laurie Morales is a 32 y/o [id number] at [redacted]w[redacted]d that presents to the ED with abdominal pain and vaginal bleeding.  The patient states +HPT on 1/18.  Last night around 1600, she felt that she was passing some material.  She went to the restroom and noticed a large clot in the basin.  She wiped and noticed more blood.  She put a pad on and took ibuprofen for the cramping.  The cramping started just before the clot and was located in her RLQ, LLQ, and her back.  The patient went to sleep and woke up in less pain and had no bleeding overnight.  On the way to the ED she noticed more blood, but without clots.  She currently is a 4/10 for pain and minimal cramping.  She has had no SOB, chest pain, dizziness, swelling of the extremities, N/V, or blurry vision.  She has noticed that her great toes have been bruised without trauma.  The patient has a history of miscarriages x 2.  She had one at 6 weeks and another at 9 weeks.    OB History    Grav Para Term Preterm Abortions TAB SAB Ect Mult Living   10 5 5  0 4 2 2  0 0 5      Past Medical History  Diagnosis Date  . No pertinent past medical history     Past Surgical History  Procedure Date  . Dilation and curettage of uterus     Family History  Problem Relation Age of Onset  . Anesthesia problems Neg Hx   . Diabetes Mother   . Asthma Daughter   . Diabetes Maternal Grandmother   . Stroke Maternal Grandmother   . Heart disease Maternal Grandmother   . Hearing loss Maternal Grandmother     History  Substance Use Topics  . Smoking status: Current Every Day Smoker -- 0.3 packs/day    Types: Cigarettes  . Smokeless tobacco: Never Used  . Alcohol Use: Yes     Comment: socially, last marijuana use x 3 days ago      Allergies: No Known Allergies  Prescriptions prior to admission  Medication Sig Dispense Refill  . ibuprofen (ADVIL,MOTRIN) 200 MG tablet Take 400 mg by mouth every 6 (six) hours as needed. pain        ROS All negative unless otherwise noted in HPI Physical Exam   Physical Exam BP 115/66  Pulse 83  Temp 98.5 F (36.9 C) (Oral)  Resp 16  Ht 5' 3.5" (1.613 m)  Wt 174 lb 12.8 oz (79.289 kg)  BMI 30.48 kg/m2  SpO2 100%  LMP 11/08/2012 GENERAL: Well-developed, well-nourished female in no acute distress.  HEENT: Normocephalic, atraumatic.  LUNGS: Clear to auscultation bilaterally.  HEART: Regular rate and rhythm. ABDOMEN: Soft, nondistended. No organomegaly. Mild tenderness to deep palpation of the lower quadrants bilaterally. No rebound or guarding.  PELVIC: Normal external female genitalia. Vagina is pink and rugated.  Normal discharge. Normal cervix contour. Wet prep and GC/Chlamydia obtained. Uterus is normal in size. No adnexal mass, mild tenderness to palpation of the suprapubic region.  EXTREMITIES: No cyanosis, clubbing, or edema, 2+ distal pulses. Slight tenderness to the lumbar region. Great toes with mild  bruising bilaterally.     Results for orders placed during the hospital encounter of 01/15/13 (from the past 24 hour(s))  URINALYSIS, ROUTINE W REFLEX MICROSCOPIC     Status: Abnormal   Collection Time   01/15/13 11:15 AM      Component Value Range   Color, Urine YELLOW  YELLOW   APPearance CLOUDY (*) CLEAR   Specific Gravity, Urine >1.030 (*) 1.005 - 1.030   pH 5.5  5.0 - 8.0   Glucose, UA NEGATIVE  NEGATIVE mg/dL   Hgb urine dipstick SMALL (*) NEGATIVE   Bilirubin Urine NEGATIVE  NEGATIVE   Ketones, ur NEGATIVE  NEGATIVE mg/dL   Protein, ur NEGATIVE  NEGATIVE mg/dL   Urobilinogen, UA 0.2  0.0 - 1.0 mg/dL   Nitrite NEGATIVE  NEGATIVE   Leukocytes, UA MODERATE (*) NEGATIVE  URINE MICROSCOPIC-ADD ON     Status: Abnormal   Collection Time   01/15/13 11:15 AM       Component Value Range   Squamous Epithelial / LPF MANY (*) RARE   WBC, UA 3-6  <3 WBC/hpf   Bacteria, UA MANY (*) RARE   Urine-Other MUCOUS PRESENT    POCT PREGNANCY, URINE     Status: Abnormal   Collection Time   01/15/13 11:18 AM      Component Value Range   Preg Test, Ur POSITIVE (*) NEGATIVE  CBC     Status: Abnormal   Collection Time   01/15/13 11:53 AM      Component Value Range   WBC 12.1 (*) 4.0 - 10.5 K/uL   RBC 3.90  3.87 - 5.11 MIL/uL   Hemoglobin 12.8  12.0 - 15.0 g/dL   HCT 45.4  09.8 - 11.9 %   MCV 94.6  78.0 - 100.0 fL   MCH 32.8  26.0 - 34.0 pg   MCHC 34.7  30.0 - 36.0 g/dL   RDW 14.7  82.9 - 56.2 %   Platelets 258  150 - 400 K/uL  HCG, QUANTITATIVE, PREGNANCY     Status: Abnormal   Collection Time   01/15/13 11:53 AM      Component Value Range   hCG, Beta Chain, Quant, S 45331 (*) <5 mIU/mL  WET PREP, GENITAL     Status: Abnormal   Collection Time   01/15/13 12:35 PM      Component Value Range   Yeast Wet Prep HPF POC NONE SEEN  NONE SEEN   Trich, Wet Prep NONE SEEN  NONE SEEN   Clue Cells Wet Prep HPF POC MODERATE (*) NONE SEEN   WBC, Wet Prep HPF POC TOO NUMEROUS TO COUNT (*) NONE SEEN      MAU Course  Procedures None  MDM Quant hCG, CBC, Wet prep, GC/Chlamydia and [place] ordered  Assessment and Plan  Assessment: 1. Vaginal bleeding in pregnancy 2. Abdominal pain in pregnancy  Plan: Discharge patient Rx for Flagyl sent to patient's pharmacy Discussed BV diagnosis and hygiene products/regimens and probiotics to prevent recurrence.   Patient plans to start prenatal are with Dr. Tamela Oddi at [facility], Caryn Bee 01/15/2013, 12:00 PM   I saw and evaluated this patient with the PA student. I have added and/or edited the above note to reflect my observations.  I agree with the assessment and plan as written above.   Freddi Starr, PA-C 01/15/2013 3:40 PM

## 2013-01-15 NOTE — MAU Provider Note (Signed)
Attestation of Attending Supervision of Advanced Practitioner (CNM/NP): Evaluation and management procedures were performed by the Advanced Practitioner under my supervision and collaboration. I have reviewed the Advanced Practitioner's note and chart, and I agree with the management and plan.  Yago Ludvigsen H. 4:14 PM

## 2013-01-15 NOTE — MAU Note (Signed)
Patient states she has had a positive home pregnancy test. States she had moderate bleeding with moderate size blood clots last with bright red spotting this am. Nothing on the pad she is presently wearing. States cramping was worse last night, slight today.

## 2013-04-27 ENCOUNTER — Emergency Department (HOSPITAL_COMMUNITY)
Admission: EM | Admit: 2013-04-27 | Discharge: 2013-04-27 | Disposition: A | Discharge status: Home / Self Care | Payer: Medicaid Other | Attending: Emergency Medicine | Admitting: Emergency Medicine

## 2013-04-27 ENCOUNTER — Encounter (HOSPITAL_COMMUNITY): Payer: Self-pay | Admitting: Emergency Medicine

## 2013-04-27 DIAGNOSIS — L539 Erythematous condition, unspecified: Secondary | ICD-10-CM | POA: Insufficient documentation

## 2013-04-27 DIAGNOSIS — L0390 Cellulitis, unspecified: Secondary | ICD-10-CM

## 2013-04-27 DIAGNOSIS — O99891 Other specified diseases and conditions complicating pregnancy: Secondary | ICD-10-CM | POA: Insufficient documentation

## 2013-04-27 DIAGNOSIS — O99330 Smoking (tobacco) complicating pregnancy, unspecified trimester: Secondary | ICD-10-CM | POA: Insufficient documentation

## 2013-04-27 DIAGNOSIS — L03319 Cellulitis of trunk, unspecified: Secondary | ICD-10-CM | POA: Insufficient documentation

## 2013-04-27 DIAGNOSIS — L02219 Cutaneous abscess of trunk, unspecified: Secondary | ICD-10-CM | POA: Insufficient documentation

## 2013-04-27 HISTORY — DX: Cutaneous abscess, unspecified: L02.91

## 2013-04-27 MED ORDER — CEPHALEXIN 500 MG PO CAPS: 500.0000 mg | ORAL_CAPSULE | Freq: Four times a day (QID) | ORAL | Status: DC

## 2013-04-27 NOTE — ED Provider Notes (Signed)
History     CSN: [id number]  Arrival date & time 04/27/13  0903   First MD Initiated Contact with Patient 04/27/13 [phone]      Chief Complaint  Patient presents with  . Recurrent Skin Infections    (Consider location/radiation/quality/duration/timing/severity/associated sxs/prior treatment) HPI Comments: Patient is a 32 year old female who presents today for evaluation of an abscess. She states it has been worsening over the past week. She has a history of abscess, but states this abscess hurts more than the others have in the past. It is a sharp pain and is very sensitive to touch. She states it drained earlier today. The discharge was a combination of blood and pus. Since that time she is feeling some improvement. She states she uses Dial antibacterial soap to prevent abscess formation. Denies hx of diabetes. She is [redacted] weeks pregnant. Denies fever, chills, nausea, vomiting, abdominal pain, shortness of breath, chest pain.   The history is provided by the patient. No language interpreter was used.    Past Medical History  Diagnosis Date  . No pertinent past medical history   . Abscess     Past Surgical History  Procedure Laterality Date  . Dilation and curettage of uterus      Family History  Problem Relation Age of Onset  . Anesthesia problems Neg Hx   . Diabetes Mother   . Asthma Daughter   . Diabetes Maternal Grandmother   . Stroke Maternal Grandmother   . Heart disease Maternal Grandmother   . Hearing loss Maternal Grandmother     History  Substance Use Topics  . Smoking status: Current Every Day Smoker -- 0.30 packs/day    Types: Cigarettes  . Smokeless tobacco: Never Used  . Alcohol Use: Yes     Comment: socially    OB History   Grav Para Term Preterm Abortions TAB SAB Ect Mult Living   10 5 5  0 4 2 2  0 0 5      Review of Systems  Constitutional: Negative for fever and chills.  Gastrointestinal: Negative for nausea, vomiting and abdominal pain.  Skin:  Positive for wound.  All other systems reviewed and are negative.    Allergies  Review of patient's allergies indicates no known allergies.  Home Medications   Current Outpatient Rx  Name  Route  Sig  Dispense  Refill  . metroNIDAZOLE (FLAGYL) 500 MG tablet   Oral   Take 1 tablet (500 mg total) by mouth 2 (two) times daily.   14 tablet   0     BP 124/76  Pulse 98  Temp(Src) 97.7 F (36.5 C) (Oral)  Resp 18  SpO2 99%  LMP 11/08/2012  Physical Exam  Nursing note and vitals reviewed. Constitutional: She is oriented to person, place, and time. She appears well-developed and well-nourished. No distress.  HENT:  Head: Normocephalic and atraumatic.  Right Ear: External ear normal.  Left Ear: External ear normal.  Nose: Nose normal.  Mouth/Throat: Oropharynx is clear and moist.  Eyes: Conjunctivae are normal.  Neck: Normal range of motion.  Cardiovascular: Normal rate, regular rhythm and normal heart sounds.   Pulmonary/Chest: Effort normal and breath sounds normal. No stridor. No respiratory distress. She has no wheezes. She has no rales.  Abdominal: Soft. She exhibits no distension.  Musculoskeletal: Normal range of motion.  Neurological: She is alert and oriented to person, place, and time. She has normal strength.  Skin: Skin is warm and dry. She is not  diaphoretic. There is erythema.  4 cm area of erythema with induration, no fluctuance  US performed - no drainable abscess   Psychiatric: She has a normal mood and affect. Her behavior is normal.    ED Course  Procedures (including critical care time)  Labs Reviewed - No data to display No results found.   1. Cellulitis       MDM  Patient presents with painful indurated area to right groin. No drainable abscess on ultrasound. She is [redacted] weeks pregnant. She was given Keflex for cellulitis as it is safe in pregnancy. Discussed warning signs and reasons to return to ED. Follow up with PCP. Vital signs stable for  discharge. Patient / Family / Caregiver informed of clinical course, understand medical decision-making process, and agree with plan.         Mora Bellman, PA-C 04/27/13 2108

## 2013-04-27 NOTE — ED Notes (Signed)
Pt reports abscess to right groin area x 1 week. Pt attempted to lance it but nothing came out. Pt also reports possibility of getting one under left arm. Pt reports changing deodorant end of feb or beginning of March.

## 2013-04-28 NOTE — ED Provider Notes (Signed)
Medical screening examination/treatment/procedure(s) were performed by non-physician practitioner and as supervising physician I was immediately available for consultation/collaboration.  Mellie Buccellato K Linker, MD 04/28/13 0907 

## 2013-05-03 ENCOUNTER — Encounter (INDEPENDENT_AMBULATORY_CARE_PROVIDER_SITE_OTHER): Payer: Medicaid Other | Admitting: Obstetrics & Gynecology

## 2013-05-03 VITALS — BP 108/74 | Temp 97.9°F | Wt 179.4 lb

## 2013-05-03 DIAGNOSIS — Z3482 Encounter for supervision of other normal pregnancy, second trimester: Secondary | ICD-10-CM

## 2013-05-03 DIAGNOSIS — Z3480 Encounter for supervision of other normal pregnancy, unspecified trimester: Secondary | ICD-10-CM

## 2013-05-03 LAB — POCT URINALYSIS DIPSTICK
Bilirubin, UA: NEGATIVE
Ketones, UA: NEGATIVE
Leukocytes, UA: NEGATIVE

## 2013-05-03 NOTE — Progress Notes (Signed)
pusle 91, patient c/o of vaginal itching, and a increase in hot flashes causing difficulty sleeping, emotional unstable, and detached. Patient was seen in ER over the weekend for cyst on right leg. She states she was diagnosed with cellulitis and was given a rx for keflex. She states she now has a painful bump under her left arm. Patient unable to wait on provider return from hospital delivery to office-. Patient left without being seen.

## 2013-05-08 ENCOUNTER — Ambulatory Visit (INDEPENDENT_AMBULATORY_CARE_PROVIDER_SITE_OTHER): Payer: Medicaid Other | Admitting: Obstetrics

## 2013-05-08 ENCOUNTER — Encounter: Payer: Self-pay | Admitting: Obstetrics

## 2013-05-08 ENCOUNTER — Other Ambulatory Visit: Payer: Self-pay | Admitting: Obstetrics

## 2013-05-08 VITALS — BP 113/76 | Temp 97.9°F | Wt 178.8 lb

## 2013-05-08 DIAGNOSIS — Z3689 Encounter for other specified antenatal screening: Secondary | ICD-10-CM

## 2013-05-08 DIAGNOSIS — Z3480 Encounter for supervision of other normal pregnancy, unspecified trimester: Secondary | ICD-10-CM

## 2013-05-08 DIAGNOSIS — Z369 Encounter for antenatal screening, unspecified: Secondary | ICD-10-CM

## 2013-05-08 DIAGNOSIS — Z3482 Encounter for supervision of other normal pregnancy, second trimester: Secondary | ICD-10-CM

## 2013-05-08 DIAGNOSIS — B3749 Other urogenital candidiasis: Secondary | ICD-10-CM

## 2013-05-08 DIAGNOSIS — F329 Major depressive disorder, single episode, unspecified: Secondary | ICD-10-CM

## 2013-05-08 DIAGNOSIS — K219 Gastro-esophageal reflux disease without esophagitis: Secondary | ICD-10-CM

## 2013-05-08 LAB — POCT URINALYSIS DIPSTICK
Bilirubin, UA: NEGATIVE
Glucose, UA: NEGATIVE
Nitrite, UA: NEGATIVE
Urobilinogen, UA: NEGATIVE
pH, UA: 6

## 2013-05-08 MED ORDER — OMEPRAZOLE 20 MG PO CPDR: 20.0000 mg | DELAYED_RELEASE_CAPSULE | Freq: Every day | ORAL | Status: DC

## 2013-05-08 MED ORDER — OB COMPLETE PETITE 35-5-1-200 MG PO CAPS: 1.0000 | ORAL_CAPSULE | Freq: Every day | ORAL | Status: DC

## 2013-05-08 MED ORDER — FLUCONAZOLE 150 MG PO TABS
150.0000 mg | ORAL_TABLET | Freq: Once | ORAL | Status: DC
Start: 2013-05-08 — End: 2013-08-14

## 2013-05-08 MED ORDER — BUPROPION HCL ER (SR) 150 MG PO TB12: 150.0000 mg | ORAL_TABLET | Freq: Every day | ORAL | Status: DC

## 2013-05-08 NOTE — Progress Notes (Signed)
Pulse- 90.  Patient that she is taking an antibiotic for cellulitis and is now having vaginal itching.   She also has a bump under her left arm that is very tender.

## 2013-05-15 ENCOUNTER — Ambulatory Visit (HOSPITAL_COMMUNITY)
Admission: RE | Admit: 2013-05-15 | Discharge: 2013-05-15 | Disposition: A | Discharge status: Home / Self Care | Payer: Medicaid Other | Source: Ambulatory Visit | Attending: Obstetrics | Admitting: Obstetrics

## 2013-05-15 DIAGNOSIS — Z1389 Encounter for screening for other disorder: Secondary | ICD-10-CM | POA: Insufficient documentation

## 2013-05-15 DIAGNOSIS — Z3689 Encounter for other specified antenatal screening: Secondary | ICD-10-CM

## 2013-05-15 DIAGNOSIS — Z363 Encounter for antenatal screening for malformations: Secondary | ICD-10-CM | POA: Insufficient documentation

## 2013-05-15 DIAGNOSIS — O358XX0 Maternal care for other (suspected) fetal abnormality and damage, not applicable or unspecified: Secondary | ICD-10-CM | POA: Insufficient documentation

## 2013-05-16 ENCOUNTER — Encounter: Payer: Self-pay | Admitting: Obstetrics

## 2013-05-17 ENCOUNTER — Ambulatory Visit (INDEPENDENT_AMBULATORY_CARE_PROVIDER_SITE_OTHER): Payer: Medicaid Other | Admitting: Obstetrics

## 2013-05-17 VITALS — BP 114/76 | Temp 97.9°F | Wt 180.6 lb

## 2013-05-17 DIAGNOSIS — Z3483 Encounter for supervision of other normal pregnancy, third trimester: Secondary | ICD-10-CM

## 2013-05-17 DIAGNOSIS — Z3480 Encounter for supervision of other normal pregnancy, unspecified trimester: Secondary | ICD-10-CM

## 2013-05-17 LAB — POCT URINALYSIS DIPSTICK
Blood, UA: NEGATIVE
Glucose, UA: NEGATIVE
Nitrite, UA: NEGATIVE
Protein, UA: NEGATIVE
Urobilinogen, UA: NEGATIVE

## 2013-05-17 NOTE — Progress Notes (Signed)
Pulse: 92

## 2013-05-18 LAB — CBC
Hemoglobin: 12 g/dL (ref 12.0–15.0)
MCH: 32.2 pg (ref 26.0–34.0)
MCHC: 33.9 g/dL (ref 30.0–36.0)
MCV: 94.9 fL (ref 78.0–100.0)

## 2013-05-18 LAB — HIV ANTIBODY (ROUTINE TESTING W REFLEX): HIV: NONREACTIVE

## 2013-05-18 LAB — GLUCOSE TOLERANCE, 2 HOURS W/ 1HR: Glucose, 1 hour: 118 mg/dL (ref 70–170)

## 2013-05-22 ENCOUNTER — Telehealth: Payer: Self-pay | Admitting: *Deleted

## 2013-05-22 NOTE — Telephone Encounter (Signed)
Pt left message requesting glucose test results. Pt advised results WNL. Pt aware of normal results and expressed understanding.

## 2013-05-30 ENCOUNTER — Encounter: Payer: Medicaid Other | Admitting: Obstetrics

## 2013-06-06 ENCOUNTER — Encounter: Payer: Medicaid Other | Admitting: Obstetrics

## 2013-08-14 ENCOUNTER — Inpatient Hospital Stay (HOSPITAL_COMMUNITY)
Admission: AD | Admit: 2013-08-14 | Discharge: 2013-08-14 | Disposition: A | Discharge status: Home / Self Care | Payer: Medicaid Other | Source: Ambulatory Visit | Attending: Obstetrics | Admitting: Obstetrics

## 2013-08-14 ENCOUNTER — Encounter: Payer: Self-pay | Admitting: Obstetrics

## 2013-08-14 ENCOUNTER — Encounter (HOSPITAL_COMMUNITY): Payer: Self-pay | Admitting: *Deleted

## 2013-08-14 DIAGNOSIS — Z3689 Encounter for other specified antenatal screening: Secondary | ICD-10-CM

## 2013-08-14 DIAGNOSIS — IMO0002 Reserved for concepts with insufficient information to code with codable children: Secondary | ICD-10-CM | POA: Insufficient documentation

## 2013-08-14 DIAGNOSIS — O479 False labor, unspecified: Secondary | ICD-10-CM

## 2013-08-14 HISTORY — DX: Other specified health status: Z78.9

## 2013-08-14 LAB — URINE MICROSCOPIC-ADD ON

## 2013-08-14 LAB — URINALYSIS, ROUTINE W REFLEX MICROSCOPIC
Bilirubin Urine: NEGATIVE
Glucose, UA: NEGATIVE mg/dL
Specific Gravity, Urine: >1.03 — ABNORMAL HIGH (ref 1.005–1.030)
Urobilinogen, UA: 0.2 mg/dL (ref 0.0–1.0)
pH: 6 (ref 5.0–8.0)

## 2013-08-14 NOTE — MAU Provider Note (Signed)
History     CSN: [id number]  Arrival date and time: 08/14/13 1733   First Provider Initiated Contact with Patient 08/14/13 1825      Chief Complaint  Patient presents with  . Labor Eval  . Leg Swelling   HPI [name] is a 32 y.o. [id number] at [redacted]w[redacted]d who presents to MAU today with multiple complaints. The patient states that she was having regular contractions last night that have become more sporadic later today. She has not had any since arrival in MAU. She states that she has been seeing spots, but only when she changes positions too quickly. She has also had LE edema that she states is relieved by rest and elevation. She denies RUQ pain. She has had headaches recently that are relieved by Tylenol and rated at 5/10 at the worst. She does endorse a fair amount of stress and "yelling a lot" at home lately.   OB History   Grav Para Term Preterm Abortions TAB SAB Ect Mult Living   10 5 5  0 4 2 2  0 0 5      Past Medical History  Diagnosis Date  . No pertinent past medical history   . Abscess   . Medical history non-contributory     Past Surgical History  Procedure Laterality Date  . Dilation and curettage of uterus      Family History  Problem Relation Age of Onset  . Anesthesia problems Neg Hx   . Diabetes Mother   . Asthma Daughter   . Diabetes Maternal Grandmother   . Stroke Maternal Grandmother   . Heart disease Maternal Grandmother   . Hearing loss Maternal Grandmother     History  Substance Use Topics  . Smoking status: Current Every Day Smoker -- 0.30 packs/day    Types: Cigarettes  . Smokeless tobacco: Never Used  . Alcohol Use: Yes     Comment: socially    Allergies: No Known Allergies  Prescriptions prior to admission  Medication Sig Dispense Refill  . acetaminophen (TYLENOL) 500 MG tablet Take 500 mg by mouth every 6 (six) hours as needed for pain.      Marland Kitchen omeprazole (PRILOSEC) 20 MG capsule Take 1 capsule (20 mg total) by mouth daily.   60 capsule  5  . Prenat-FeCbn-FeAspGl-FA-Omega (OB COMPLETE PETITE) 35-5-1-200 MG CAPS Take 1 capsule by mouth daily before breakfast.  90 capsule  3  . [DISCONTINUED] buPROPion (WELLBUTRIN SR) 150 MG 12 hr tablet Take 1 tablet (150 mg total) by mouth daily before breakfast.  30 tablet  11  . [DISCONTINUED] cephALEXin (KEFLEX) 500 MG capsule Take 1 capsule (500 mg total) by mouth 4 (four) times daily.  40 capsule  0  . [DISCONTINUED] fluconazole (DIFLUCAN) 150 MG tablet Take 1 tablet (150 mg total) by mouth once.  1 tablet  2    Review of Systems  Constitutional: Negative for fever and malaise/fatigue.  Gastrointestinal: Positive for abdominal pain. Negative for nausea, vomiting, diarrhea and constipation.  Genitourinary: Negative for dysuria, urgency and frequency.       Neg - vaginal bleeding, discharge, LOF  Neurological: Positive for dizziness. Negative for loss of consciousness.   Physical Exam   Blood pressure 120/80, pulse 93, temperature 98 F (36.7 C), temperature source Oral, resp. rate 20, height 5' 2.5" (1.588 m), weight 195 lb 6.4 oz (88.633 kg), last menstrual period 11/08/2012, SpO2 100.00%.  Physical Exam  Constitutional: She is oriented to person, place, and time. She appears  well-developed and well-nourished. No distress.  HENT:  Head: Normocephalic and atraumatic.  Cardiovascular: Normal rate, regular rhythm and normal heart sounds.   Respiratory: Effort normal and breath sounds normal. No respiratory distress.  GI: Soft. Bowel sounds are normal. She exhibits no distension and no mass. There is no tenderness. There is no rebound and no guarding.  Musculoskeletal: She exhibits no edema and no tenderness.  Neurological: She is alert and oriented to person, place, and time. She has normal reflexes.  No clonus  Skin: Skin is warm and dry. No erythema.  Psychiatric: She has a normal mood and affect.  Dilation: 2 Effacement (%): Thick Cervical Position: Middle Exam by::  B.Bethea RN  Results for orders placed during the hospital encounter of 08/14/13 (from the past 24 hour(s))  URINALYSIS, ROUTINE W REFLEX MICROSCOPIC     Status: Abnormal   Collection Time    08/14/13  6:00 PM      Result Value Range   Color, Urine YELLOW  YELLOW   APPearance CLOUDY (*) CLEAR   Specific Gravity, Urine >1.030 (*) 1.005 - 1.030   pH 6.0  5.0 - 8.0   Glucose, UA NEGATIVE  NEGATIVE mg/dL   Hgb urine dipstick TRACE (*) NEGATIVE   Bilirubin Urine NEGATIVE  NEGATIVE   Ketones, ur 15 (*) NEGATIVE mg/dL   Protein, ur NEGATIVE  NEGATIVE mg/dL   Urobilinogen, UA 0.2  0.0 - 1.0 mg/dL   Nitrite NEGATIVE  NEGATIVE   Leukocytes, UA NEGATIVE  NEGATIVE  URINE MICROSCOPIC-ADD ON     Status: Abnormal   Collection Time    08/14/13  6:00 PM      Result Value Range   Squamous Epithelial / LPF MANY (*) RARE   WBC, UA 0-2  <3 WBC/hpf   RBC / HPF 0-2  <3 RBC/hpf   Bacteria, UA MANY (*) RARE   Fetal Monitoring: Baseline: 130 bpm, moderate variability, + accelerations, no decelerations Contractions: None  MAU Course  Procedures None  MDM BP is normal, dizziness and seeing spots is positional only. Patient's headaches are relieved with Tylenol and rated at 5/10 at the worst Patient has a reactive NST and no contractions since arrival  Assessment and Plan  A: Braxton Hicks contractions  P: Discharge home Labor precautions and fetal kick counts discussed and included on AVS Patient advised to increase PO hydration as tolerated Patient advised to call Dr. Clearance Coots for an appointment this week Patient may return to MAU as needed  Freddi Starr, PA-C  08/14/2013, 7:09 PM

## 2013-08-14 NOTE — MAU Note (Signed)
Patient states she is having irregular contractions. States feel and lower legs more swollen today and seeing spots with a headache. Denies bleeding or leaking and reports less fetal movement than usual.

## 2013-08-14 NOTE — MAU Note (Signed)
Pt states she has not been feeling baby move as much as she had been feeling the baby move and states she has been to the MD "in a minute". Pt states she has swelling that is new to her.

## 2013-08-15 ENCOUNTER — Inpatient Hospital Stay (HOSPITAL_COMMUNITY)
Admission: AD | Admit: 2013-08-15 | Discharge: 2013-08-16 | DRG: 780 | Disposition: A | Discharge status: Home / Self Care | Payer: Medicaid Other | Source: Ambulatory Visit | Attending: Obstetrics & Gynecology | Admitting: Obstetrics & Gynecology

## 2013-08-15 ENCOUNTER — Encounter (HOSPITAL_COMMUNITY): Payer: Self-pay | Admitting: *Deleted

## 2013-08-15 ENCOUNTER — Inpatient Hospital Stay (HOSPITAL_COMMUNITY)
Admission: AD | Admit: 2013-08-15 | Discharge: 2013-08-15 | Disposition: A | Discharge status: Home / Self Care | Payer: Medicaid Other | Source: Ambulatory Visit | Attending: Obstetrics | Admitting: Obstetrics

## 2013-08-15 DIAGNOSIS — O471 False labor at or after 37 completed weeks of gestation: Secondary | ICD-10-CM

## 2013-08-15 DIAGNOSIS — N949 Unspecified condition associated with female genital organs and menstrual cycle: Secondary | ICD-10-CM | POA: Insufficient documentation

## 2013-08-15 DIAGNOSIS — O368390 Maternal care for abnormalities of the fetal heart rate or rhythm, unspecified trimester, not applicable or unspecified: Secondary | ICD-10-CM | POA: Insufficient documentation

## 2013-08-15 DIAGNOSIS — O479 False labor, unspecified: Secondary | ICD-10-CM | POA: Insufficient documentation

## 2013-08-15 DIAGNOSIS — N898 Other specified noninflammatory disorders of vagina: Secondary | ICD-10-CM

## 2013-08-15 MED ORDER — NALBUPHINE HCL 10 MG/ML IJ SOLN
10.0000 mg | Freq: Once | INTRAMUSCULAR | Status: AC
  Administered 2013-08-15: 10 mg via INTRAVENOUS
  Filled 2013-08-15: qty 1

## 2013-08-15 MED ORDER — ONDANSETRON 8 MG PO TBDP
8.0000 mg | ORAL_TABLET | Freq: Once | ORAL | Status: AC
Start: 2013-08-15 — End: 2013-08-15
  Administered 2013-08-15: 8 mg via ORAL
  Filled 2013-08-15: qty 1

## 2013-08-15 MED ORDER — NALBUPHINE HCL 10 MG/ML IJ SOLN
10.0000 mg | Freq: Once | INTRAMUSCULAR | Status: AC
  Administered 2013-08-15: 10 mg via INTRAMUSCULAR
  Filled 2013-08-15: qty 1

## 2013-08-15 MED ORDER — LACTATED RINGERS IV BOLUS (SEPSIS)
1000.0000 mL | Freq: Once | INTRAVENOUS | Status: AC
Start: 2013-08-15 — End: 2013-08-15
  Administered 2013-08-15: 1000 mL via INTRAVENOUS

## 2013-08-15 NOTE — Progress Notes (Signed)
Fern test done

## 2013-08-15 NOTE — MAU Note (Signed)
Pt reports contractions x 24 hours, worsening tonight.

## 2013-08-15 NOTE — Progress Notes (Signed)
Pt turned to left side lying position 

## 2013-08-15 NOTE — MAU Note (Signed)
PT SAYS SHE THOUGHT  SROM AT 2300.    WAS HERE TODAY AT 530PM- VE - 2 CM.  HAS RETURNED TONIGHT  VE 2 CM.    LAST SEX- TODAY.   DENIES HSV AND MRSA.

## 2013-08-15 NOTE — MAU Note (Signed)
Pt states she lost her mucous plug  

## 2013-08-15 NOTE — MAU Provider Note (Signed)
Chief Complaint:  Labor Eval  First Provider Initiated Contact with Patient 08/15/13 0301     HPI: Laurie Morales is a 32 y.o. [id number] at [redacted]w[redacted]d who presents to maternity admissions reporting contractions every 10 minutes, loss of mucus plug and ? LOF after intercourse x 1 at 2300. No further leaking. Denies vaginal bleeding, fever or chills. Good fetal movement.   Past Medical History: Past Medical History  Diagnosis Date  . No pertinent past medical history   . Abscess   . Medical history non-contributory     Past obstetric history: OB History  Gravida Para Term Preterm AB SAB TAB Ectopic Multiple Living  10 5 5  0 4 2 2  0 0 5    # Outcome Date GA Lbr Len/2nd Weight Sex Delivery Anes PTL Lv  10 CUR           9 TAB           8 TAB           7 SAB           6 SAB           5 TRM           4 TRM           3 TRM           2 TRM           1 TRM               Past Surgical History: Past Surgical History  Procedure Laterality Date  . Dilation and curettage of uterus       Family History: Family History  Problem Relation Age of Onset  . Anesthesia problems Neg Hx   . Diabetes Mother   . Asthma Daughter   . Diabetes Maternal Grandmother   . Stroke Maternal Grandmother   . Heart disease Maternal Grandmother   . Hearing loss Maternal Grandmother     Social History: History  Substance Use Topics  . Smoking status: Current Every Day Smoker -- 0.30 packs/day    Types: Cigarettes  . Smokeless tobacco: Never Used  . Alcohol Use: Yes     Comment: socially    Allergies: No Known Allergies  Meds:  Prescriptions prior to admission  Medication Sig Dispense Refill  . acetaminophen (TYLENOL) 500 MG tablet Take 500 mg by mouth every 6 (six) hours as needed for pain.      Marland Kitchen omeprazole (PRILOSEC) 20 MG capsule Take 1 capsule (20 mg total) by mouth daily.  60 capsule  5  . Prenat-FeCbn-FeAspGl-FA-Omega (OB COMPLETE PETITE) 35-5-1-200 MG CAPS Take 1 capsule by mouth daily  before breakfast.  90 capsule  3    ROS: Pertinent findings in history of present illness.  Physical Exam  Blood pressure 124/74, pulse 74, temperature 98 F (36.7 C), temperature source Oral, resp. rate 16, last menstrual period 11/08/2012. GENERAL: Well-developed, well-nourished female in mild-moderate distress with contractions.  HEENT: normocephalic HEART: normal rate RESP: normal effort ABDOMEN: Soft, non-tender, gravid appropriate for gestational age EXTREMITIES: Nontender, no edema NEURO: alert and oriented SPECULUM EXAM: NEFG, scant amount of thin, white, odorless discharge, no blood, cervix clean. Negative pooling.   Dilation: 2 Effacement (%): Thick Cervical Position: Middle Exam by:: Ivonne Andrew CNM BOW felt  FHT:  Baseline indeterminate, FHR 140-180's , moderate variability, accelerations present, no decelerations Contractions: q 10 mins   Labs: Fern negative.  Imaging:  No results found.  MAU Course: FHR baseline continues to be indeterminate. Possibly 175 now despite position change to side . Will give IV bolus, then BPP if still non-reactive.   0445:  EFM: 150's, moderate variability, 15 next 15 accelerations, no decelerations TOCO: q10, moderate Dilation: 3.5 Effacement (%): Thick Cervical Position: Middle Station: -2 Exam by:: Ayham Word, CNM  Notified Dr. Clearance Coots of cervical change, negative fern and fetal heart rate and improvement with IV fluids. Discussed discharge with Percocet versus IV pain meds and repeat cervical exam. Patient much more uncomfortable than when she arrived in MAU. Will observe further for labor. Nubain 10 mg IV and IM given.  Further cervical change.  Assessment: 1. False labor after 37 weeks of gestation without delivery   2. Vaginal discharge in pregnancy, third trimester   3. Fetal tachycardia before the onset of labor, resolved    Plan: Discharge home Labor precautions and fetal kick counts   Follow-up Information   Call  HARPER,CHARLES A, MD. (to schedule n appointment)    Specialty:  Obstetrics and Gynecology   Contact information:   [address] [place] [address] [phone]       Follow up with [facility] [place] [facility] ADMISS[facility]. (As needed if symptoms worsen)    Contact information:   [address] [id number] [place] [address] [phone]        Medication List         acetaminophen 500 MG tablet  Commonly known as:  TYLENOL  Take 500 mg by mouth every 6 (six) hours as needed for pain.     OB COMPLETE PETITE 35-5-1-200 MG Caps  Take 1 capsule by mouth daily before breakfast.     omeprazole 20 MG capsule  Commonly known as:  PRILOSEC  Take 1 capsule (20 mg total) by mouth daily.       Hopkins, CNM 08/15/2013 3:01 AM

## 2013-08-15 NOTE — Progress Notes (Signed)
Pt states she felt a gush between 2300 and 0000

## 2013-08-15 NOTE — MAU Note (Signed)
Pt admits to intercourse this past evening

## 2013-08-15 NOTE — MAU Note (Signed)
To 166 for eval of labor

## 2013-08-16 ENCOUNTER — Inpatient Hospital Stay (HOSPITAL_COMMUNITY)
Admission: AD | Admit: 2013-08-16 | Discharge: 2013-08-18 | DRG: 775 | Disposition: A | Discharge status: Home / Self Care | Payer: Medicaid Other | Source: Ambulatory Visit | Attending: Obstetrics & Gynecology | Admitting: Obstetrics & Gynecology

## 2013-08-16 ENCOUNTER — Encounter (HOSPITAL_COMMUNITY): Payer: Self-pay

## 2013-08-16 DIAGNOSIS — D649 Anemia, unspecified: Secondary | ICD-10-CM | POA: Diagnosis not present

## 2013-08-16 DIAGNOSIS — O9903 Anemia complicating the puerperium: Principal | ICD-10-CM | POA: Diagnosis not present

## 2013-08-16 DIAGNOSIS — IMO0002 Reserved for concepts with insufficient information to code with codable children: Secondary | ICD-10-CM | POA: Diagnosis present

## 2013-08-16 LAB — TYPE AND SCREEN
ABO/RH(D): A POS
Antibody Screen: NEGATIVE

## 2013-08-16 MED ORDER — BENZOCAINE-MENTHOL 20-0.5 % EX AERO: 1.0000 "application " | INHALATION_SPRAY | CUTANEOUS | Status: DC | PRN

## 2013-08-16 MED ORDER — FERROUS SULFATE 325 (65 FE) MG PO TABS
325.0000 mg | ORAL_TABLET | Freq: Two times a day (BID) | ORAL | Status: DC
  Administered 2013-08-16 – 2013-08-18 (×4): 325 mg via ORAL
  Filled 2013-08-16 (×4): qty 1

## 2013-08-16 MED ORDER — ONDANSETRON HCL 4 MG/2ML IJ SOLN: 4.0000 mg | INTRAMUSCULAR | Status: DC | PRN

## 2013-08-16 MED ORDER — LANOLIN HYDROUS EX OINT: 1.0000 "application " | TOPICAL_OINTMENT | CUTANEOUS | Status: DC | PRN

## 2013-08-16 MED ORDER — MEASLES, MUMPS & RUBELLA VAC ~~LOC~~ INJ
0.5000 mL | INJECTION | Freq: Once | SUBCUTANEOUS | Status: DC
  Filled 2013-08-16: qty 0.5

## 2013-08-16 MED ORDER — MAGNESIUM HYDROXIDE 400 MG/5ML PO SUSP: 30.0000 mL | ORAL | Status: DC | PRN

## 2013-08-16 MED ORDER — SIMETHICONE 80 MG PO CHEW: 80.0000 mg | CHEWABLE_TABLET | ORAL | Status: DC | PRN

## 2013-08-16 MED ORDER — DIPHENHYDRAMINE HCL 25 MG PO CAPS: 25.0000 mg | ORAL_CAPSULE | Freq: Four times a day (QID) | ORAL | Status: DC | PRN

## 2013-08-16 MED ORDER — OXYCODONE-ACETAMINOPHEN 5-325 MG PO TABS
1.0000 | ORAL_TABLET | ORAL | Status: DC | PRN
  Administered 2013-08-16: 2 via ORAL
  Administered 2013-08-17: 1 via ORAL
  Filled 2013-08-16: qty 2
  Filled 2013-08-16: qty 1

## 2013-08-16 MED ORDER — OXYTOCIN 10 UNIT/ML IJ SOLN
INTRAMUSCULAR | Status: AC
  Administered 2013-08-16: 10 [IU] via INTRAMUSCULAR
  Filled 2013-08-16: qty 1

## 2013-08-16 MED ORDER — ERYTHROMYCIN 5 MG/GM OP OINT
TOPICAL_OINTMENT | OPHTHALMIC | Status: AC
  Administered 2013-08-16: 1
  Filled 2013-08-16: qty 1

## 2013-08-16 MED ORDER — TETANUS-DIPHTH-ACELL PERTUSSIS 5-2.5-18.5 LF-MCG/0.5 IM SUSP
0.5000 mL | Freq: Once | INTRAMUSCULAR | Status: AC
  Administered 2013-08-17: 0.5 mL via INTRAMUSCULAR

## 2013-08-16 MED ORDER — PRENATAL MULTIVITAMIN CH
1.0000 | ORAL_TABLET | Freq: Every day | ORAL | Status: DC
  Administered 2013-08-16 – 2013-08-17 (×2): 1 via ORAL
  Filled 2013-08-16 (×2): qty 1

## 2013-08-16 MED ORDER — WITCH HAZEL-GLYCERIN EX PADS: 1.0000 "application " | MEDICATED_PAD | CUTANEOUS | Status: DC | PRN

## 2013-08-16 MED ORDER — ONDANSETRON HCL 4 MG PO TABS: 4.0000 mg | ORAL_TABLET | ORAL | Status: DC | PRN

## 2013-08-16 MED ORDER — DIBUCAINE 1 % RE OINT: 1.0000 "application " | TOPICAL_OINTMENT | RECTAL | Status: DC | PRN

## 2013-08-16 MED ORDER — PNEUMOCOCCAL VAC POLYVALENT 25 MCG/0.5ML IJ INJ
0.5000 mL | INJECTION | INTRAMUSCULAR | Status: AC
  Administered 2013-08-17: 0.5 mL via INTRAMUSCULAR
  Filled 2013-08-16 (×2): qty 0.5

## 2013-08-16 MED ORDER — IBUPROFEN 600 MG PO TABS
600.0000 mg | ORAL_TABLET | Freq: Four times a day (QID) | ORAL | Status: DC
  Administered 2013-08-16 – 2013-08-18 (×6): 600 mg via ORAL
  Filled 2013-08-16 (×7): qty 1

## 2013-08-16 MED ORDER — ZOLPIDEM TARTRATE 5 MG PO TABS: 5.0000 mg | ORAL_TABLET | Freq: Every evening | ORAL | Status: DC | PRN

## 2013-08-16 MED ORDER — SENNOSIDES-DOCUSATE SODIUM 8.6-50 MG PO TABS
2.0000 | ORAL_TABLET | Freq: Every day | ORAL | Status: DC
  Administered 2013-08-16 – 2013-08-17 (×2): 2 via ORAL

## 2013-08-16 NOTE — MAU Provider Note (Signed)
Chief Complaint:  No chief complaint on file.  First Provider Initiated Contact with Patient 08/16/13 [phone]     HPI: Laurie Morales is a 32 y.o. [id number] at [redacted]w[redacted]d by LMP and 9 week [place] who presents to maternity admissions actively pushing, vtx +2, light MSF. SROM'd en route to hospital. Delivered in next contraction. See delivery note.   GBS not seen on chart.    Past Medical History: Past Medical History  Diagnosis Date  . No pertinent past medical history   . Abscess   . Medical history non-contributory     Past obstetric history: OB History  Gravida Para Term Preterm AB SAB TAB Ectopic Multiple Living  10 5 5  0 4 2 2  0 0 5    # Outcome Date GA Lbr Len/2nd Weight Sex Delivery Anes PTL Lv  10 CUR           9 TAB           8 TAB           7 SAB           6 SAB           5 TRM           4 TRM           3 TRM           2 TRM           1 TRM               Past Surgical History: Past Surgical History  Procedure Laterality Date  . Dilation and curettage of uterus       Family History: Family History  Problem Relation Age of Onset  . Anesthesia problems Neg Hx   . Diabetes Mother   . Asthma Daughter   . Diabetes Maternal Grandmother   . Stroke Maternal Grandmother   . Heart disease Maternal Grandmother   . Hearing loss Maternal Grandmother     Social History: History  Substance Use Topics  . Smoking status: Current Every Day Smoker -- 0.30 packs/day    Types: Cigarettes  . Smokeless tobacco: Never Used  . Alcohol Use: No     Comment: socially    Allergies: No Known Allergies  Meds:  Prescriptions prior to admission  Medication Sig Dispense Refill  . acetaminophen (TYLENOL) 500 MG tablet Take 500 mg by mouth every 6 (six) hours as needed for pain.      Marland Kitchen omeprazole (PRILOSEC) 20 MG capsule Take 1 capsule (20 mg total) by mouth daily.  60 capsule  5  . Prenat-FeCbn-FeAspGl-FA-Omega (OB COMPLETE PETITE) 35-5-1-200 MG CAPS Take 1 capsule by mouth daily  before breakfast.  90 capsule  3    ROS: Pertinent findings in history of present illness.  Physical Exam  Blood pressure 123/77, pulse 75, temperature 98.8 F (37.1 C), temperature source Oral, resp. rate 18, last menstrual period 11/08/2012. GENERAL: Well-developed, well-nourished female in severe distress.  HEENT: normocephalic HEART: normal rate RESP: normal effort ABDOMEN: Soft, non-tender, gravid appropriate for gestational age EXTREMITIES: Nontender, no edema NEURO: alert and oriented  10/100/+2   FHT:  UTA due to delivery in progress. Contractions: q 2 mins   Labs: NA  Imaging:  NA  MAU Course: Dr. Tamela Oddi notified of delivery.  Pitocin IM given.  Bleeding stable.   Assessment: 1. Vaginal delivery     Plan: Admit to Postpartum. Routine orders.  Dorathy Kinsman, CNM  08/16/2013 6:32 AM

## 2013-08-16 NOTE — Clinical Social Work Maternal (Signed)
    Clinical Social Work Department PSYCHOSOCIAL ASSESSMENT - MATERNAL/CHILD 08/16/2013  Patient:  Laurie Morales, Laurie Morales  Account Number:  [id number]  Admit Date:  08/16/2013  Marjo Bicker Name:   Sinda Du    Clinical Social Worker:  Nobie Putnam, LCSW   Date/Time:  08/16/2013 12:42 PM  Date Referred:  08/16/2013   Referral source  CN     Referred reason  Substance Abuse  Depression/Anxiety   Other referral source:    I:  FAMILY / HOME ENVIRONMENT Child's legal guardian:  PARENT  Guardian - Name Guardian - Age Guardian - Address  Kenyatte Gruber [address]; [place], [place] [address]  Alysia Penna 30 (same as above)   Other household support members/support persons Name Relationship DOB  Anthonette Legato MOTHER    DAUGHTER 60 years old   DAUGHTER 71 years old   DAUGHTER 34 years old   DAUGHTER 42 years old   SON 42 years old   BROTHER    Other support:    II  PSYCHOSOCIAL DATA Information Source:  Patient Interview  Event organiser Employment:   Surveyor, quantity resources:  [employer] If Medicaid - County:  [employer] Other  Chemical engineer / Grade:   Maternity Care Coordinator / Child Services Coordination / Early Interventions:  Cultural issues impacting care:    III  STRENGTHS Strengths  Adequate Resources  Home prepared for Child (including basic supplies)  Supportive family/friends   Strength comment:    IV  RISK FACTORS AND CURRENT PROBLEMS Current Problem:  YES   Risk Factor & Current Problem Patient Issue Family Issue Risk Factor / Current Problem Comment  Substance Abuse Y N Hx of MJ use  Mental Illness Y N Hx of depression    V  SOCIAL WORK ASSESSMENT CSW referral received to assess pt's history of depression & MJ use.  Pt admits to smoking MJ occasionally when out with friends.  She stopped when she received pregnancy confirmation in January.  She denies any other illegal substance use & familiar with hospital drug  testing policy from a previous delivery (2007).  Family has history with CPS involvement but not currently.  UDS & meconium collection pending.  Pt was prescribed Wellbutrin during 3rd trimester, after she described depressed feeling to MD. Pt told CSW that she was feeling "overwhelmed, 5 children at home, & living in between her mother & FOB's grandmothers home."  She took the medication for 1 month before stopping due to unwanted side effects.  The medication was helpful & she plans to restart upon discharge.  She denies any SI history.  Presently, the pt & family are living with her mother & stable.  She was recently approved for [employer] 8 in [facility] & plans to move soon.  She has all the necessary supplies for the infant & appears to be bonding well at this time.  FOB is involved & supportive, per pt.  CSW will monitor drug screen results & make a referral if needed.      VI SOCIAL WORK PLAN Social Work Plan  No Further Intervention Required / No Barriers to Discharge   Type of pt/family education:   If child protective services report - county:   If child protective services report - date:   Information/referral to community resources comment:   Other social work plan:

## 2013-08-16 NOTE — Lactation Note (Signed)
This note was copied from the chart of Laurie Morales. Lactation Consultation Note Mom states she does want to breast feed. Baby at the left breast when I enter room, mom very comfortable, baby just finished a feeding.  Mom breast fed her youngest child without difficulty and wants to breast feed this one as well.  Reviewed br feeding basics with mom; discussed the concerns she has about taking Wellbutrin while breast feeding; copy of Medications and Mother's Milk provided for mom. Reviewed baby and me book, discussed breast feeding services, BFSG, community resources.  Enc mom to call if she has any concerns.   Patient Name: Laurie Konnie Noffsinger [id number] Date: 08/16/2013 Reason for consult: Initial assessment   Maternal Data Formula Feeding for Exclusion: No Reason for exclusion: Mother's choice to forumla feed on admision Does the patient have breastfeeding experience prior to this delivery?: Yes  Feeding Feeding Type: Breast Milk Length of feed: 15 min  LATCH Score/Interventions Latch: Grasps breast easily, tongue down, lips flanged, rhythmical sucking.  Audible Swallowing:  (Unknown; baby finished as I entered room)  Type of Nipple: Everted at rest and after stimulation  Comfort (Breast/Nipple): Soft / non-tender     Hold (Positioning): No assistance needed to correctly position infant at breast.     Lactation Tools Discussed/Used     Consult Status Consult Status: PRN    Lenard Forth 08/16/2013, 12:04 PM

## 2013-08-16 NOTE — H&P (Signed)
Laurie Morales is a 32 y.o. female presenting for contractions. Maternal Medical History:  Reason for admission: Contractions.   Contractions: Onset was yesterday.    Fetal activity: Perceived fetal activity is normal.    Prenatal complications: no prenatal complications Prenatal Complications - Diabetes: none.    OB History   Grav Para Term Preterm Abortions TAB SAB Ect Mult Living   10 6 6  0 4 2 2  0 0 6     Past Medical History  Diagnosis Date  . No pertinent past medical history   . Abscess   . Medical history non-contributory    Past Surgical History  Procedure Laterality Date  . Dilation and curettage of uterus     Family History: family history includes Asthma in her daughter; Diabetes in her maternal grandmother and mother; Hearing loss in her maternal grandmother; Heart disease in her maternal grandmother; Stroke in her maternal grandmother. There is no history of Anesthesia problems. Social History:  reports that she has been smoking Cigarettes.  She has been smoking about 0.30 packs per day. She has never used smokeless tobacco. She reports that she uses illicit drugs (Marijuana). She reports that she does not drink alcohol.     Review of Systems  Constitutional: Negative for fever.  Eyes: Negative for blurred vision.  Respiratory: Negative for shortness of breath.   Gastrointestinal: Negative for vomiting.  Skin: Negative for rash.  Neurological: Negative for headaches.      Blood pressure 123/77, pulse 75, temperature 98.8 F (37.1 C), temperature source Oral, resp. rate 18, last menstrual period 11/08/2012, unknown if currently breastfeeding. Maternal Exam:  Abdomen: not evaluated.  Introitus: not evaluated.     Physical Exam  Constitutional: She appears well-developed and well-nourished.    Prenatal labs: ABO, Rh:   Antibody:   Rubella:   RPR: NON REAC (05/29 1432)  HBsAg:    HIV: NON REACTIVE (05/29 1432)    Assessment/Plan: S/P  precipitous SVD  Admit Routine postpartum care   JACKSON-MOORE,Ryer Asato A 08/16/2013, 6:38 AM

## 2013-08-17 LAB — CBC
HCT: 27.9 % — ABNORMAL LOW (ref 36.0–46.0)
Hemoglobin: 9.5 g/dL — ABNORMAL LOW (ref 12.0–15.0)
MCHC: 34.1 g/dL (ref 30.0–36.0)
MCV: 94.9 fL (ref 78.0–100.0)

## 2013-08-17 LAB — RUBELLA SCREEN: Rubella: 0.82 Index (ref <0.90)

## 2013-08-17 NOTE — Plan of Care (Signed)
Problem: Discharge Progression Outcomes Goal: MMR given as ordered Outcome: Not Met (add Reason) Rubella staus- pending

## 2013-08-17 NOTE — Progress Notes (Signed)
Post Partum Day 1 Subjective: no complaints, up ad lib, voiding, tolerating PO and + flatus Patient concerned w/ anxiety and depression, desires to continue on Wellbutrin. Has concerns of BRF while on medication.  Plans BTL, desires micronor prior to procedure.  Objective: Blood pressure 117/75, pulse 78, temperature 98.3 F (36.8 C), temperature source Oral, resp. rate 19, height 5\' 2"  (1.575 m), weight 195 lb (88.451 kg), last menstrual period 11/08/2012, SpO2 95.00%, unknown if currently breastfeeding.  Physical Exam:  General: alert and cooperative Lochia: appropriate Uterine Fundus: firm Incision: NA DVT Evaluation: No evidence of DVT seen on physical exam.   Recent Labs  08/17/13 0550  HGB 9.5*  HCT 27.9*    Assessment/Plan: Plan for discharge tomorrow, Breastfeeding, Social Work consult and Contraception Micronor Schedule appt to sign Tubal consent paperwork. Micronor for [facility] until then. Wellbutrin is a Level 3 drug, safe while breastfeeding. Will continue medication. Reviewed signs of PPD w/ patient, RTC PRN.  Plan iron PP for anemia. Reviewed PP education and teaching.  Mehak Roskelley CNM   LOS: 1 day   [facility], Diedre Maclellan 08/17/2013, 12:41 PM

## 2013-08-18 DIAGNOSIS — IMO0002 Reserved for concepts with insufficient information to code with codable children: Secondary | ICD-10-CM | POA: Diagnosis present

## 2013-08-18 MED ORDER — BUPROPION HCL 100 MG PO TABS: 100.0000 mg | ORAL_TABLET | Freq: Two times a day (BID) | ORAL | Status: DC

## 2013-08-18 MED ORDER — NORETHINDRONE 0.35 MG PO TABS: 1.0000 | ORAL_TABLET | Freq: Every day | ORAL | Status: DC

## 2013-08-18 NOTE — Lactation Note (Signed)
This note was copied from the chart of Laurie Morales. Lactation Consultation Note: Follow up visit with mom. She reports that baby is nursing well but Daddy wants to give some bottles of formula. Reports that breasts are feeling fuller this morning. Reviewed engorgement prevention and treatment. No questions at present. To call prn  Patient Name: Laurie Morales [id number] Date: 08/18/2013 Reason for consult: Follow-up assessment   Maternal Data    Feeding   LATCH Score/Interventions                      Lactation Tools Discussed/Used     Consult Status Consult Status: Complete    Pamelia Hoit 08/18/2013, 9:32 AM

## 2013-08-18 NOTE — Discharge Summary (Signed)
  Obstetric Discharge Summary Reason for Admission: onset of labor Prenatal Procedures: none Intrapartum Procedures: spontaneous vaginal delivery Postpartum Procedures: none Complications-Operative and Postpartum: none  Hemoglobin  Date Value Range Status  08/17/2013 9.5* 12.0 - 15.0 g/dL Final     HCT  Date Value Range Status  08/17/2013 27.9* 36.0 - 46.0 % Final    Physical Exam:  General: alert Lochia: appropriate Uterine: firm Incision: n/a DVT Evaluation: No evidence of DVT seen on physical exam.  Discharge Diagnoses: Active Problems:   Normal delivery   Maternal anemia complicating pregnancy, childbirth, or the puerperium   Discharge Information: Date: 08/18/2013 Activity: pelvic rest Diet: routine Medications:  Prior to Admission medications   Medication Sig Start Date End Date Taking? Authorizing Provider  Prenatal Vit-Fe Fumarate-FA (PRENATAL MULTIVITAMIN) TABS tablet Take 1 tablet by mouth daily at 12 noon.   Yes Historical Provider, MD  buPROPion (WELLBUTRIN) 100 MG tablet Take 1 tablet (100 mg total) by mouth 2 (two) times daily. 08/18/13   Antionette Char, MD  norethindrone (ORTHO MICRONOR) 0.35 MG tablet Take 1 tablet (0.35 mg total) by mouth daily. 2nd Sunday start 08/18/13   Antionette Char, MD    Condition: stable Instructions: refer to routine discharge instructions Discharge to: home Follow-up Information   Follow up with Antionette Char A, MD. Schedule an appointment as soon as possible for a visit in 2 weeks.   Specialty:  Obstetrics and Gynecology   Contact information:   [address] [place] [address] [phone]       Newborn Data:  Live born female  Birth Weight: 6 lb 1.5 oz (2765 g) APGAR: 8, 9   Home with mother.  JACKSON-MOORE,Kelten Enochs A 08/18/2013, 10:35 AM

## 2013-08-28 ENCOUNTER — Encounter: Payer: Self-pay | Admitting: Obstetrics & Gynecology

## 2013-09-06 ENCOUNTER — Ambulatory Visit: Payer: Medicaid Other | Admitting: Obstetrics & Gynecology

## 2014-05-21 ENCOUNTER — Telehealth: Payer: Self-pay | Admitting: *Deleted

## 2014-05-21 DIAGNOSIS — K219 Gastro-esophageal reflux disease without esophagitis: Secondary | ICD-10-CM

## 2014-05-21 MED ORDER — OMEPRAZOLE 20 MG PO CPDR: 20.0000 mg | DELAYED_RELEASE_CAPSULE | Freq: Every day | ORAL | Status: DC

## 2014-05-21 NOTE — Telephone Encounter (Signed)
Pharmacy contacted office on behalf of the patient requesting a refill on Omeprazole DR 20 mg #60 Take by mouth once daily.

## 2014-05-21 NOTE — Telephone Encounter (Signed)
OK for refill, # 30, Take once a day.

## 2014-07-20 IMAGING — US US OB COMP LESS 14 WK
1 series · 13 of 28 positions shown · non-contrast
Comparison: none

[Series 1: us ob comp less 14 wks · 30 acquisitions, 13 frames shown]
[im 2/30]
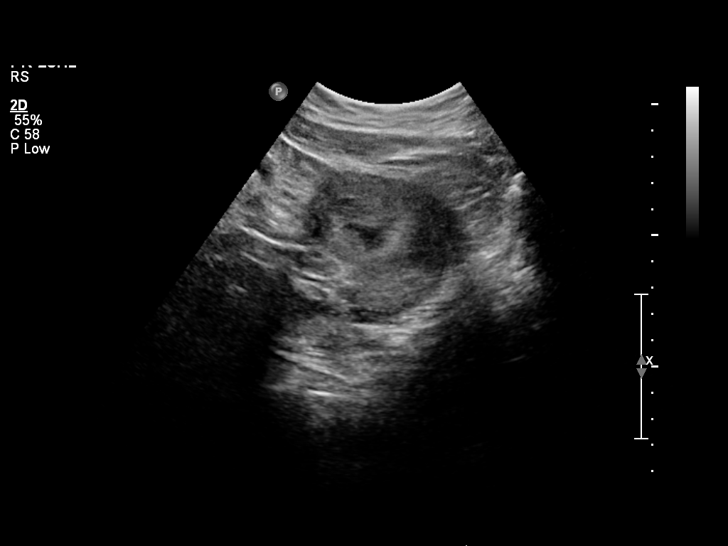
[im 4/30]
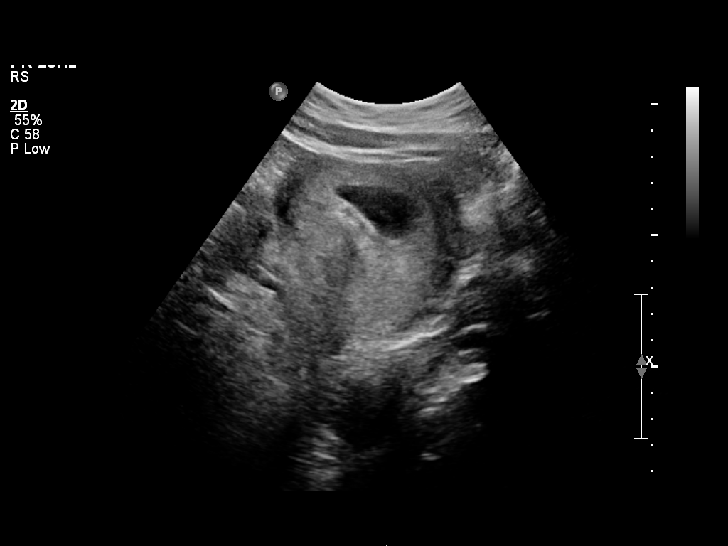
[im 6/30]
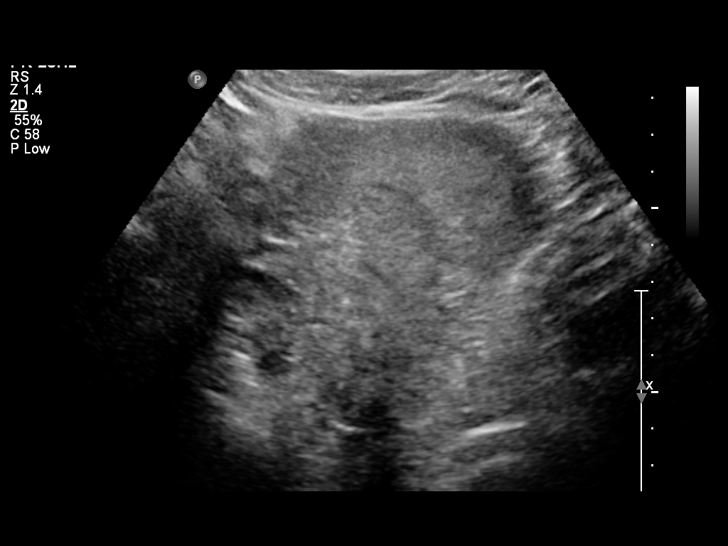
[im 8/30]
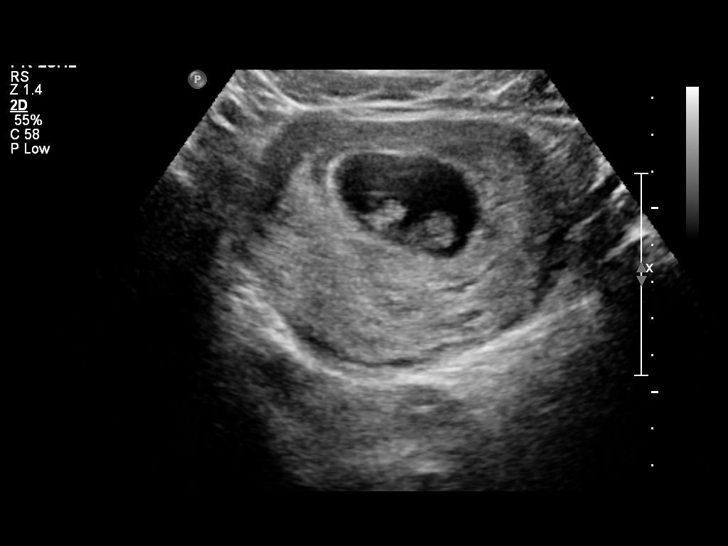
[im 10/30]
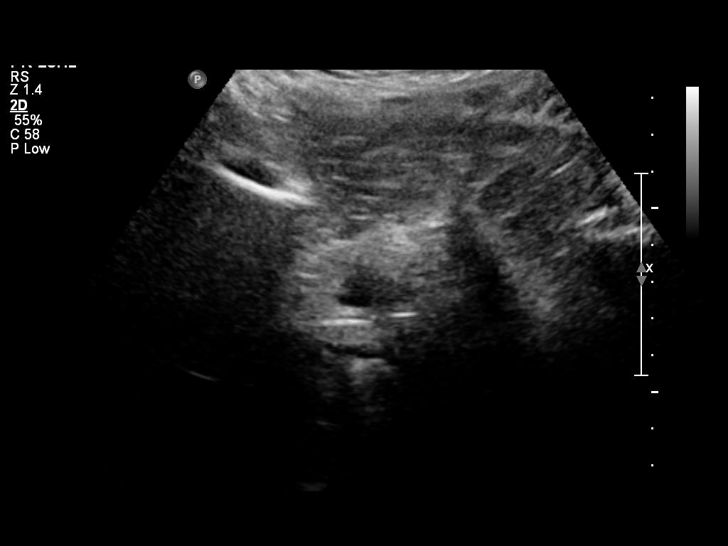
[im 12/30]
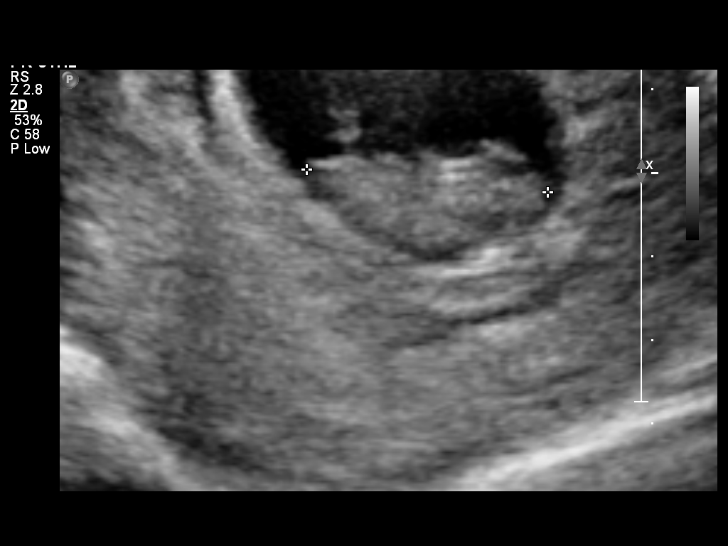
[im 16/30]
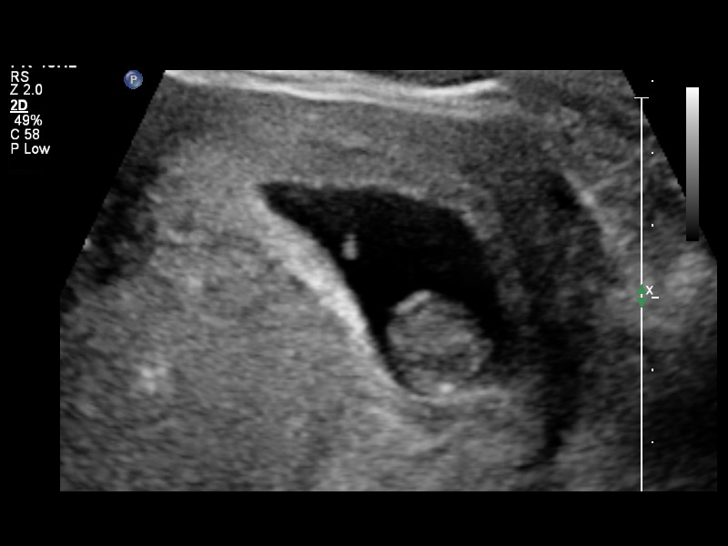
[im 18/30]
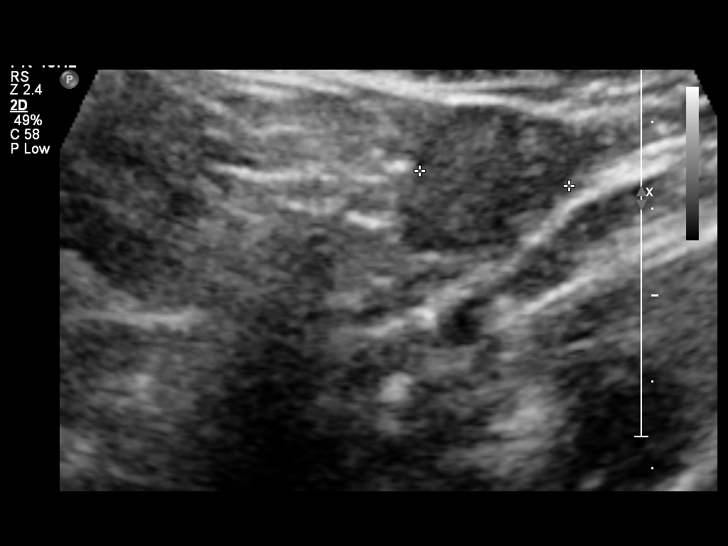
[im 20/30]
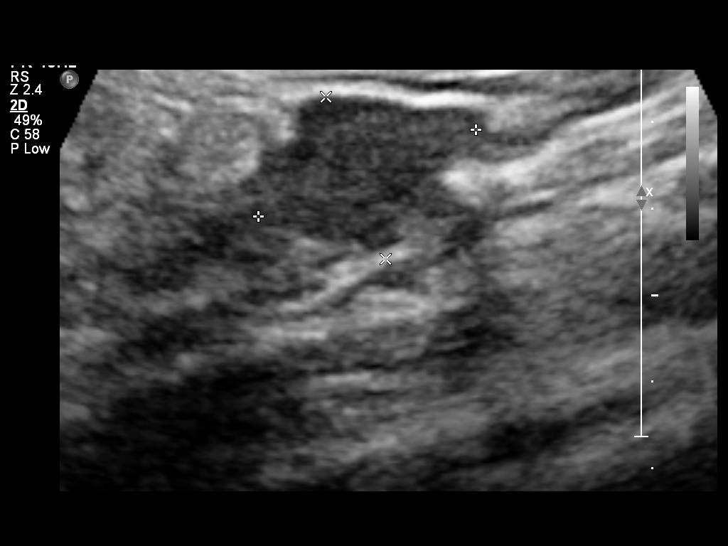
[im 22/30]
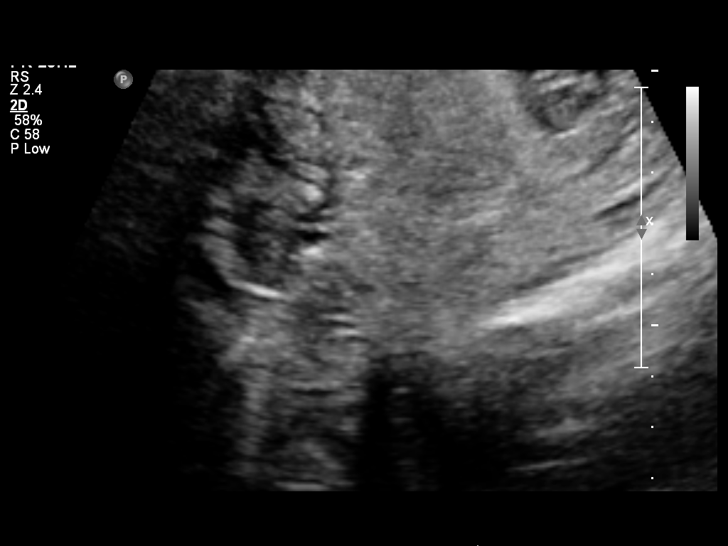
[im 24/30]
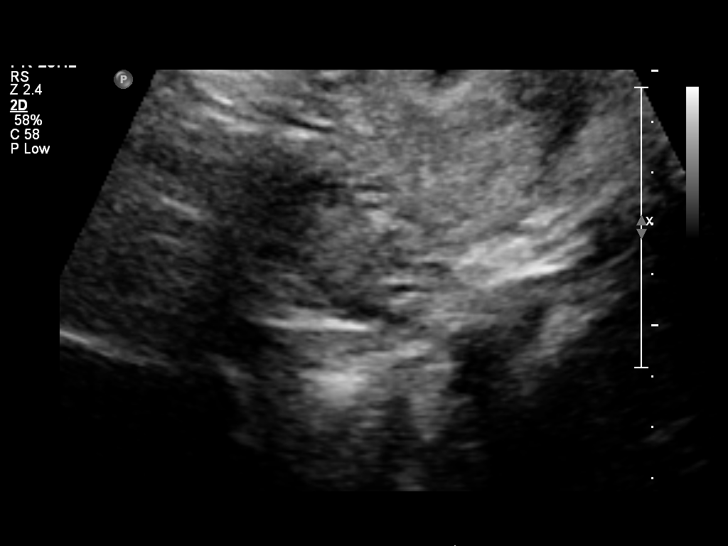
[im 26/30]
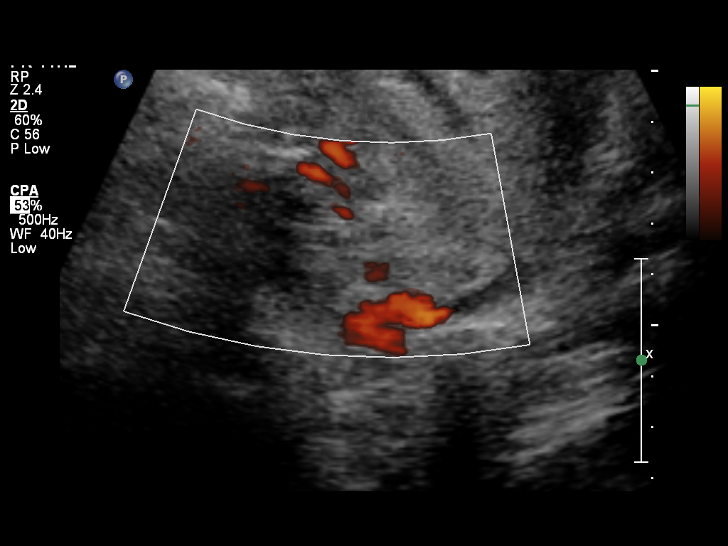
[im 28/30]
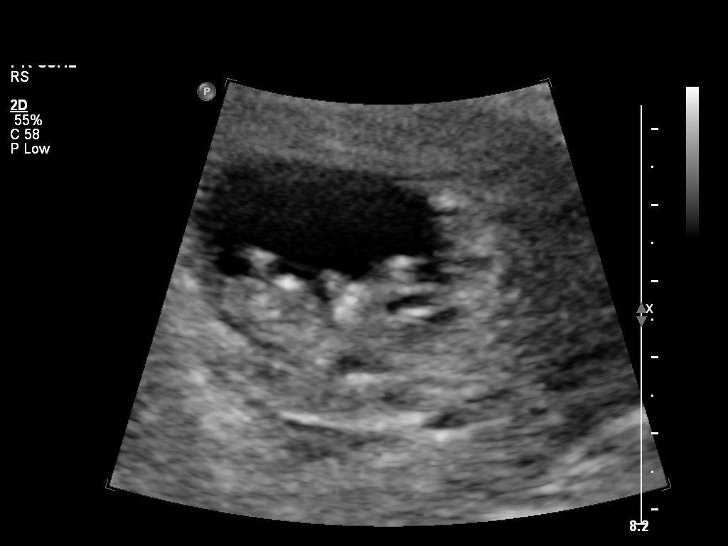

[13 of 28 positions shown; findings below may reference images not displayed]

OBSTETRICS REPORT
                      (Signed Final 01/15/2013 [DATE])

Service(s) Provided

 US OB COMP LESS 14 WKS                                76801.0
Indications

 Vaginal bleeding, unknown etiology
 Abdominal pain - generalized
 Pregnancy with inconclusive fetal viability
Fetal Evaluation

 Num Of Fetuses:    1
 Preg. Location:    Intrauterine
 Gest. Sac:         Intrauterine, small
                    subchorionic bleed
 Yolk Sac:          Not visualized
 Fetal Pole:        Visualized
 Fetal Heart Rate:  174                         bpm
 Cardiac Activity:  Observed
Biometry

 CRL:     27.7  mm    G. Age:   9w 3d                  EDD:   08/17/13
Gestational Age

 LMP:           9w 5d        Date:   11/08/12                 EDD:   08/15/13
 Best:          9w 5d     Det. By:   LMP  (11/08/12)          EDD:   08/15/13
Cervix Uterus Adnexa

 Cervix:       Closed
 Uterus:       No abnormality visualized.
 Cul De Sac:   No free fluid seen.
 Left Ovary:   Within normal limits.
 Right Ovary:  Within normal limits.
 Adnexa:     No abnormality visualized.
Impression

 Single living intrauterine embryo. The estimated gestational
 age is 9w 5d based on LMP  (11/08/12). Size and dates
 correlate well.

## 2014-10-21 ENCOUNTER — Encounter (HOSPITAL_COMMUNITY): Payer: Self-pay

## 2014-11-17 IMAGING — US US OB DETAIL+14 WK
2 series · 12 of 28 positions shown · non-contrast
Comparison: none

[Series 1: us ob detail +14 wk · 9 of 72 slices shown (1 of 2)]
[im 4/72]
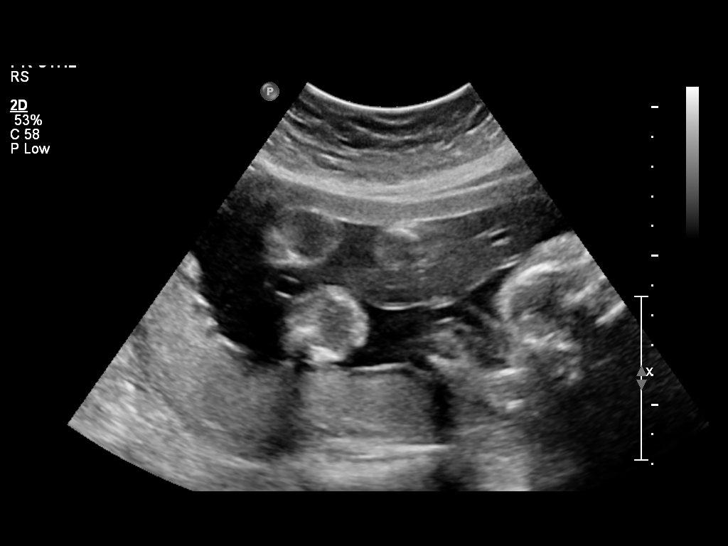
[im 11/72]
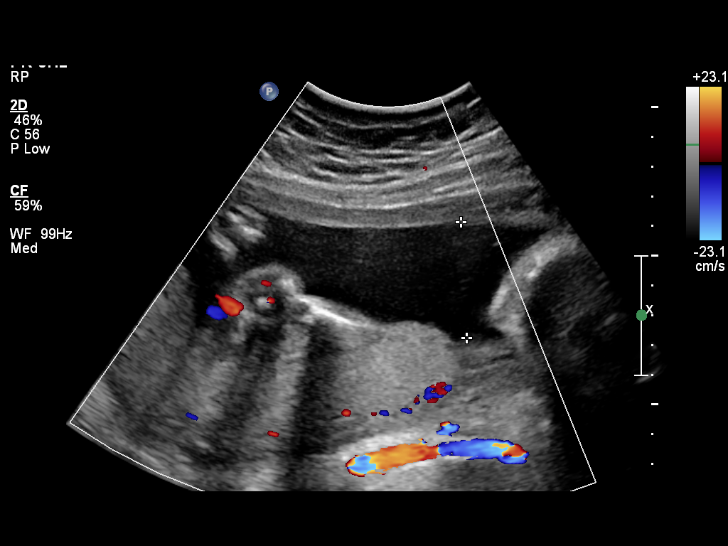
[im 18/72]
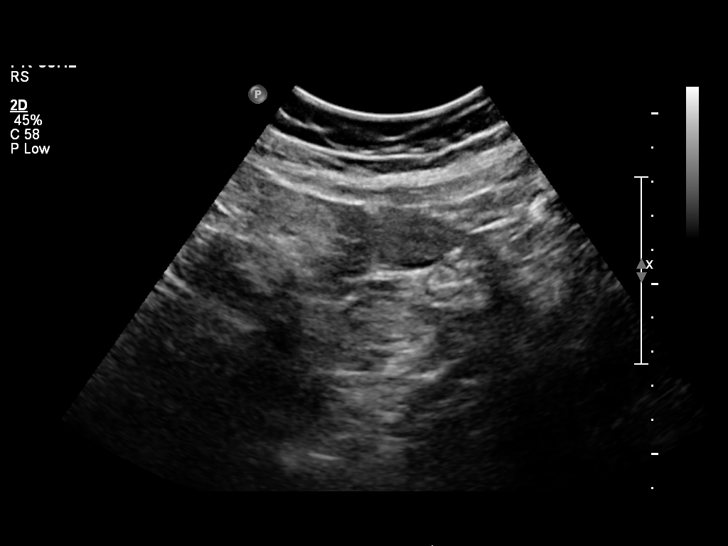
[im 29/72]
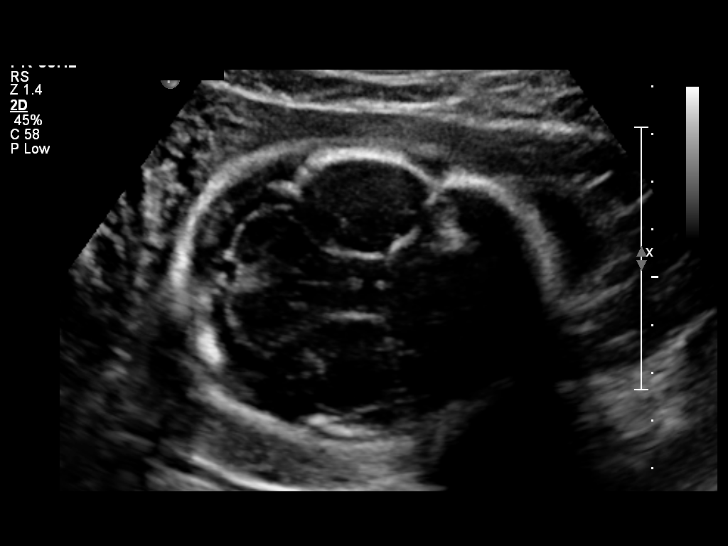
[im 36/72]
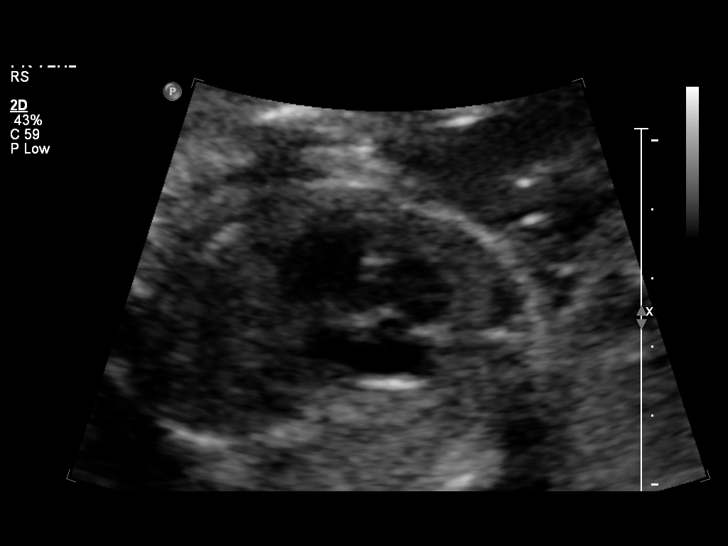
[im 43/72]
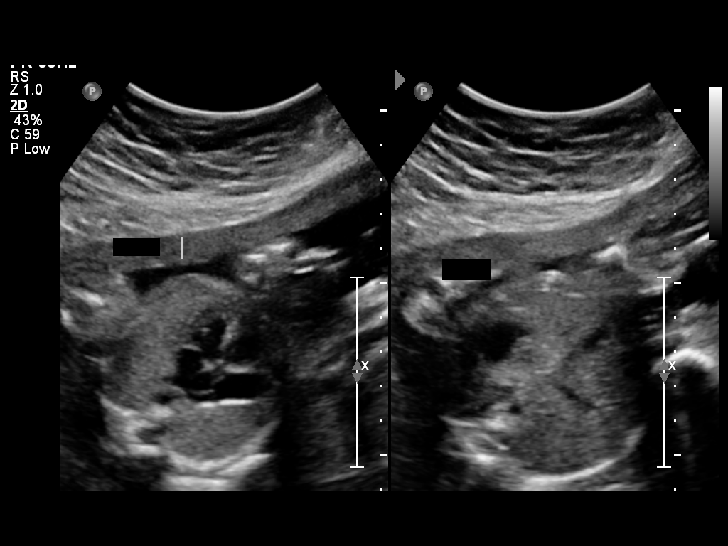
[im 54/72]
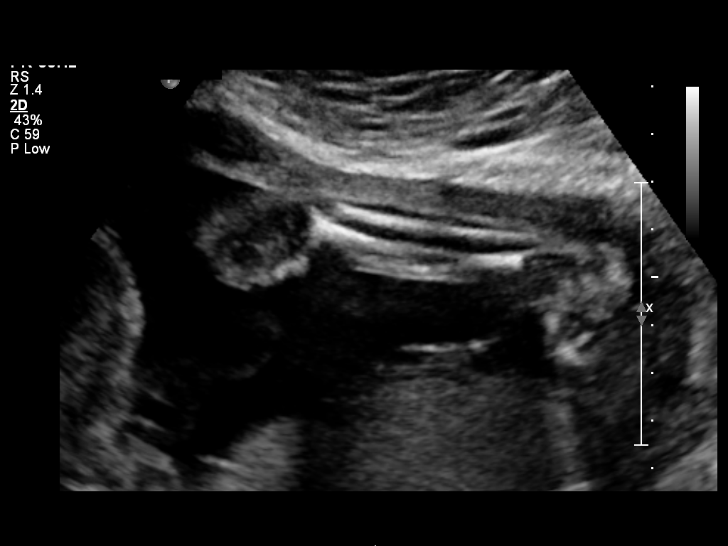
[im 61/72]
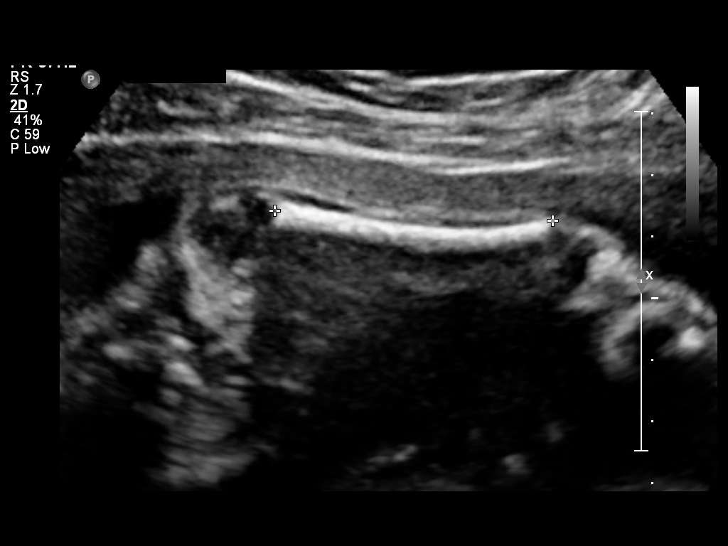
[im 68/72]
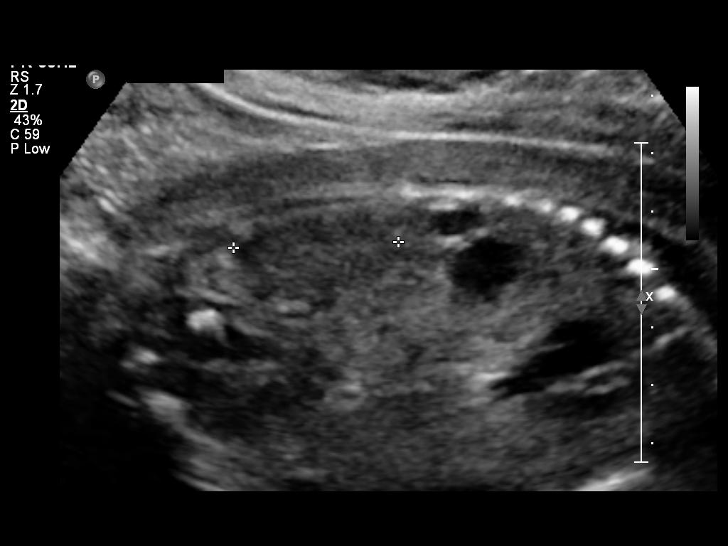

[Series 1: us ob detail +14 wk · 3 of 23 slices shown (2 of 2)]
[im 4/23]
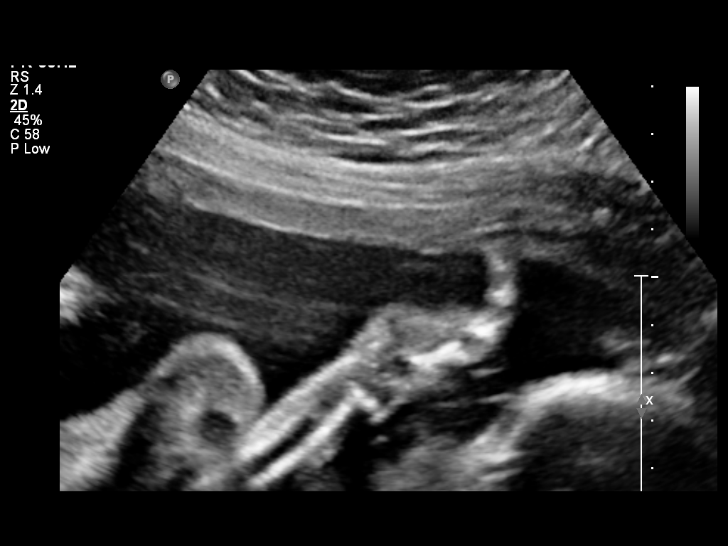
[im 12/23]
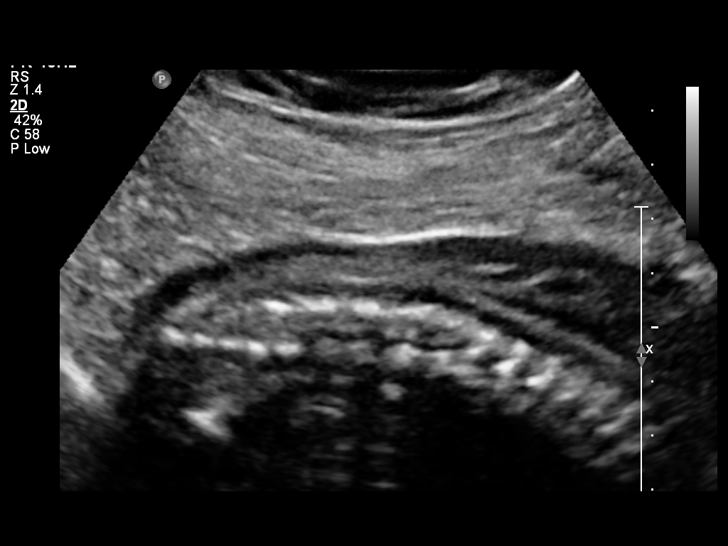
[im 19/23]
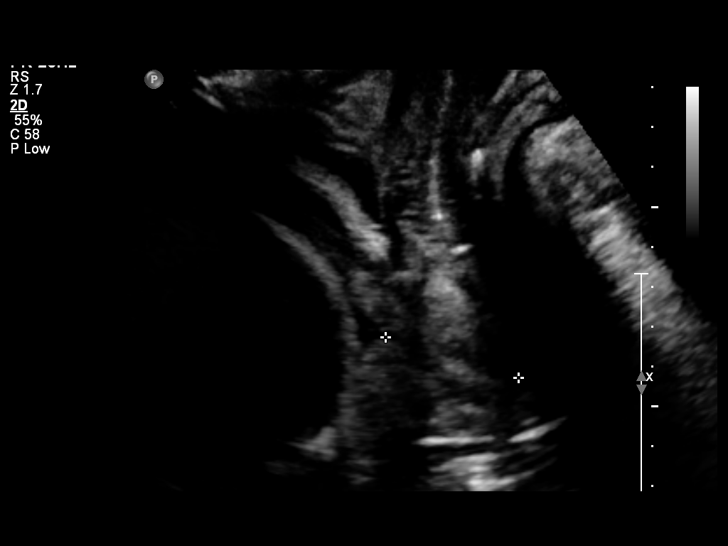

[12 of 28 positions shown; findings below may reference images not displayed]

OBSTETRICS REPORT
                      (Signed Final 05/15/2013 [DATE])

Service(s) Provided

 US OB DETAIL + 14 WK                                  76811.0
Indications

 Detailed fetal anatomic survey
Fetal Evaluation

 Num Of Fetuses:    1
 Fetal Heart Rate:  161                         bpm
 Cardiac Activity:  Observed
 Presentation:      Cephalic
 Placenta:          Posterior, above cervical
                    os
 P. Cord            Visualized
 Insertion:

 Amniotic Fluid
 AFI FV:      Subjectively within normal limits
                                             Larg Pckt:     3.9  cm
Biometry

 BPD:     65.2  mm    G. Age:   26w 3d                CI:        72.22   70 - 86
                                                      FL/HC:      20.2   18.6 -

 HC:     244.1  mm    G. Age:   26w 4d       15  %    HC/AC:      1.11   1.05 -

 AC:       219  mm    G. Age:   26w 3d       27  %    FL/BPD:     75.5   71 - 87
 FL:      49.2  mm    G. Age:   26w 4d       27  %    FL/AC:      22.5   20 - 24
 HUM:     44.6  mm    G. Age:   26w 3d       37  %

 Est. FW:     937  gm      2 lb 1 oz     44  %
Gestational Age

 LMP:           26w 6d       Date:   11/08/12                 EDD:   08/15/13
 U/S Today:     26w 4d                                        EDD:   08/17/13
 Best:          26w 6d    Det. By:   LMP  (11/08/12)          EDD:   08/15/13
2nd Trimester Genetic Sonogram - Trisomy 21 Screening

 Age:                                             31          Risk=1:   481

 Structural anomalies (inc. cardiac):             N/A
 Echogenic bowel:                                 No
 Hypoplastic / absent midphalanx 5th Digit:       No
 Wide space 8st-7nd toes:                         N/A
 Pyelectasis:                                     No
 2-vessel umbilical cord:                         No
 Echogenic cardiac foci:                          No

 Incomplete anatomic evaluation today.
Anatomy

 Cranium:          Appears normal         Aortic Arch:      Appears normal
 Fetal Cavum:      Appears normal         Ductal Arch:      Appears normal
 Ventricles:       Appears normal         Diaphragm:        Appears normal
 Choroid Plexus:   Appears normal         Stomach:          Appears normal, left
                                                            sided
 Cerebellum:       Appears normal         Abdomen:          Appears normal
 Posterior Fossa:  Appears normal         Abdominal Wall:   Appears nml (cord
                                                            insert, abd wall)
 Nuchal Fold:      Not applicable (>20    Cord Vessels:     Appears normal (3
                   wks GA)                                  vessel cord)
 Face:             Orbits appear          Kidneys:          Appear normal
                   normal
 Lips:             Appears normal         Bladder:          Appears normal
 Heart:            Appears normal         Spine:            Not well visualized
                   (4CH, axis, and
                   situs)
 RVOT:             Not well visualized    Lower             Appears normal
                                          Extremities:
 LVOT:             Appears normal         Upper             Appears normal
                                          Extremities:

 Other:  Fetus appears to be a female. Right 5th visualized. Left heel
         visualized. Technically difficult due to fetal position.
Targeted Anatomy

 Fetal Central Nervous System
 Lat. Ventricles:
Cervix Uterus Adnexa

 Cervical Length:   3.28      cm

 Cervix:       Normal appearance by translabial scan.
 Left Ovary:   Size(cm) L: 2.95 x W: 2.39 x H: 1.72  Volume(cc):
 Right Ovary:  Size(cm) L: 3.89 x W: 2.43 x H: 1.75  Volume(cc):

 Adnexa:     No abnormality visualized.
Impression

 Siup demonstrating an EGA by ultrasound of 26w 4d. This
 corresponds well with expected EGA by LMP of 26w 6d.

 The fetal spine and right cardiac outflow were incompletely
 assessed today due to fetal position. Visualized anatomy
 appears normal. No intracranial findings suggestive of neural
 tube defect were seen. No soft markers for Down Syndrome
 are seen.

 Follow up evaluation to attempt visualization of the spine can
 be performed in 1-3 weeks. Correlation with other aneuploidy
 screening results, if available, would be recommended for a
 more complete risk assessment.

 Subjectively and quantitatively normal amniotic fluid volume.
 Normal cervical length.

## 2014-12-02 ENCOUNTER — Ambulatory Visit: Payer: Medicaid Other | Admitting: Obstetrics & Gynecology

## 2014-12-17 ENCOUNTER — Encounter: Payer: Self-pay | Admitting: Obstetrics & Gynecology

## 2015-02-12 ENCOUNTER — Ambulatory Visit: Payer: Medicaid Other | Admitting: Obstetrics

## 2015-12-21 NOTE — L&D Delivery Note (Signed)
Delivery Note At 9:14 PM a viable female was delivered via  (Presentation:vertex LOA;  ).  APGAR:9 ,9 ; weight  .   Placenta status:spont ,via [place] .  Cord:3vc  with the following complications none: .  Cord pH: n/a  Anesthesia:  epidural Episiotomy:  none Lacerations:  none Suture Repair: n/a Est. Blood Loss 100(mL):    Mom to postpartum.  Baby to Couplet care / Skin to Skin.  Laurie Morales 10/10/2016, 9:24 PM

## 2016-05-30 ENCOUNTER — Inpatient Hospital Stay (HOSPITAL_COMMUNITY)
Admission: AD | Admit: 2016-05-30 | Discharge: 2016-05-30 | Disposition: A | Discharge status: Home / Self Care | Payer: Medicaid Other | Source: Ambulatory Visit | Attending: Obstetrics and Gynecology | Admitting: Obstetrics and Gynecology

## 2016-05-30 ENCOUNTER — Encounter (HOSPITAL_COMMUNITY): Payer: Self-pay | Admitting: *Deleted

## 2016-05-30 ENCOUNTER — Inpatient Hospital Stay (HOSPITAL_COMMUNITY): Payer: Medicaid Other

## 2016-05-30 DIAGNOSIS — N760 Acute vaginitis: Secondary | ICD-10-CM

## 2016-05-30 DIAGNOSIS — O26892 Other specified pregnancy related conditions, second trimester: Secondary | ICD-10-CM | POA: Insufficient documentation

## 2016-05-30 DIAGNOSIS — O99332 Smoking (tobacco) complicating pregnancy, second trimester: Secondary | ICD-10-CM | POA: Diagnosis not present

## 2016-05-30 DIAGNOSIS — F17210 Nicotine dependence, cigarettes, uncomplicated: Secondary | ICD-10-CM | POA: Diagnosis not present

## 2016-05-30 DIAGNOSIS — R109 Unspecified abdominal pain: Secondary | ICD-10-CM

## 2016-05-30 DIAGNOSIS — Z3A20 20 weeks gestation of pregnancy: Secondary | ICD-10-CM | POA: Insufficient documentation

## 2016-05-30 DIAGNOSIS — Z3687 Encounter for antenatal screening for uncertain dates: Secondary | ICD-10-CM

## 2016-05-30 DIAGNOSIS — O0930 Supervision of pregnancy with insufficient antenatal care, unspecified trimester: Secondary | ICD-10-CM

## 2016-05-30 DIAGNOSIS — O26899 Other specified pregnancy related conditions, unspecified trimester: Secondary | ICD-10-CM

## 2016-05-30 DIAGNOSIS — O0932 Supervision of pregnancy with insufficient antenatal care, second trimester: Secondary | ICD-10-CM | POA: Diagnosis not present

## 2016-05-30 DIAGNOSIS — O9989 Other specified diseases and conditions complicating pregnancy, childbirth and the puerperium: Secondary | ICD-10-CM

## 2016-05-30 DIAGNOSIS — B9689 Other specified bacterial agents as the cause of diseases classified elsewhere: Secondary | ICD-10-CM

## 2016-05-30 LAB — WET PREP, GENITAL
Sperm: NONE SEEN
Trich, Wet Prep: NONE SEEN
Yeast Wet Prep HPF POC: NONE SEEN

## 2016-05-30 LAB — URINALYSIS, ROUTINE W REFLEX MICROSCOPIC
BILIRUBIN URINE: NEGATIVE
Glucose, UA: NEGATIVE mg/dL
Hgb urine dipstick: NEGATIVE
Ketones, ur: NEGATIVE mg/dL
NITRITE: NEGATIVE
Protein, ur: NEGATIVE mg/dL
SPECIFIC GRAVITY, URINE: 1.02 (ref 1.005–1.030)
pH: 6.5 (ref 5.0–8.0)

## 2016-05-30 LAB — URINE MICROSCOPIC-ADD ON: RBC / HPF: NONE SEEN RBC/hpf (ref 0–5)

## 2016-05-30 MED ORDER — METRONIDAZOLE 500 MG PO TABS: 500.0000 mg | ORAL_TABLET | Freq: Two times a day (BID) | ORAL | Status: DC

## 2016-05-30 NOTE — MAU Note (Signed)
Pt states she has had ? Vaginal discharge, she has to wear panti liners but is not seeing any discharge.  Has also been irritated & itching, used vagisil for 3 days but sx's became worse & she is now swollen.

## 2016-05-30 NOTE — MAU Provider Note (Signed)
History   [id number] @ 20.6 wks in with pelvic pressure, vaginal swelling and irritation that has been going on for days. Denies any problems with prior pregnancies.  CSN: [id number]  Arrival date & time 05/30/16  1545   First Provider Initiated Contact with Patient 05/30/16 1837      Chief Complaint  Patient presents with  . Abdominal Pain    HPI  Past Medical History  Diagnosis Date  . No pertinent past medical history   . Abscess   . Medical history non-contributory     Past Surgical History  Procedure Laterality Date  . Dilation and curettage of uterus      Family History  Problem Relation Age of Onset  . Anesthesia problems Neg Hx   . Diabetes Mother   . Asthma Daughter   . Diabetes Maternal Grandmother   . Stroke Maternal Grandmother   . Heart disease Maternal Grandmother   . Hearing loss Maternal Grandmother     Social History  Substance Use Topics  . Smoking status: Current Every Day Smoker -- 0.25 packs/day    Types: Cigarettes  . Smokeless tobacco: Never Used  . Alcohol Use: No     Comment: socially    OB History    Gravida Para Term Preterm AB TAB SAB Ectopic Multiple Living   11 6 6  0 4 2 2  0 0 6      Review of Systems  Constitutional: Negative.   HENT: Negative.   Eyes: Negative.   Respiratory: Negative.   Cardiovascular: Negative.   Gastrointestinal: Positive for abdominal pain.  Endocrine: Negative.   Genitourinary: Positive for vaginal discharge.  Musculoskeletal: Negative.   Skin: Negative.   Allergic/Immunologic: Negative.   Neurological: Negative.   Hematological: Negative.   Psychiatric/Behavioral: Negative.     Allergies  Review of patient's allergies indicates no known allergies.  Home Medications  No current outpatient prescriptions on file.  BP 115/68 mmHg  Pulse 92  Temp(Src) 98.3 F (36.8 C) (Oral)  Resp 18  Ht 5\' 3"  (1.6 m)  Wt 175 lb 0.6 oz (79.398 kg)  BMI 31.01 kg/m2  LMP 02/09/2016  (Approximate)  Physical Exam  Constitutional: She is oriented to person, place, and time. She appears well-developed and well-nourished.  HENT:  Head: Normocephalic.  Eyes: Pupils are equal, round, and reactive to light.  Neck: Normal range of motion.  Cardiovascular: Normal rate, regular rhythm, normal heart sounds and intact distal pulses.   Pulmonary/Chest: Effort normal and breath sounds normal.  Abdominal: Soft. Bowel sounds are normal.  Genitourinary: Vagina normal and uterus normal.  Musculoskeletal: Normal range of motion.  Neurological: She is alert and oriented to person, place, and time. She has normal reflexes.  Skin: Skin is warm and dry.  Psychiatric: She has a normal mood and affect. Her behavior is normal. Judgment and thought content normal.    MAU Course  Procedures (including critical care time)  Labs Reviewed  URINALYSIS, ROUTINE W REFLEX MICROSCOPIC (NOT AT [facility]) - Abnormal; Notable for the following:    Leukocytes, UA TRACE (*)    All other components within normal limits  URINE MICROSCOPIC-ADD ON - Abnormal; Notable for the following:    Squamous Epithelial / LPF 6-30 (*)    Bacteria, UA FEW (*)    All other components within normal limits   No results found.   1. Unsure of LMP (last menstrual period) as reason for ultrasound scan   2. No prenatal care in current pregnancy  MDM  Dx: abd pain in preg Vag discharge  Exam: wet prep. GC and Chla obtained. SVE firm/cl/post/high. D/c home

## 2016-05-30 NOTE — MAU Note (Signed)
Patient presents with c/o pain and pressure no prenatal care, went to abortion clinic early March was told around 13 weeks, has not felt baby move, fatigued.

## 2016-05-30 NOTE — Discharge Instructions (Signed)

## 2016-05-31 LAB — GC/CHLAMYDIA PROBE AMP (~~LOC~~) NOT AT ARMC
CHLAMYDIA, DNA PROBE: NEGATIVE
NEISSERIA GONORRHEA: NEGATIVE

## 2016-07-06 ENCOUNTER — Encounter: Payer: Medicaid Other | Admitting: Advanced Practice Midwife

## 2016-07-06 ENCOUNTER — Encounter: Payer: Self-pay | Admitting: Advanced Practice Midwife

## 2016-10-07 ENCOUNTER — Inpatient Hospital Stay (HOSPITAL_COMMUNITY)
Admission: AD | Admit: 2016-10-07 | Discharge: 2016-10-08 | Disposition: A | Discharge status: Home / Self Care | Payer: Medicaid Other | Source: Ambulatory Visit | Attending: Obstetrics & Gynecology | Admitting: Obstetrics & Gynecology

## 2016-10-07 ENCOUNTER — Encounter (HOSPITAL_COMMUNITY): Payer: Self-pay | Admitting: Certified Nurse Midwife

## 2016-10-07 DIAGNOSIS — O26893 Other specified pregnancy related conditions, third trimester: Secondary | ICD-10-CM | POA: Insufficient documentation

## 2016-10-07 DIAGNOSIS — Z3A39 39 weeks gestation of pregnancy: Secondary | ICD-10-CM | POA: Diagnosis not present

## 2016-10-07 DIAGNOSIS — O471 False labor at or after 37 completed weeks of gestation: Secondary | ICD-10-CM

## 2016-10-07 DIAGNOSIS — O479 False labor, unspecified: Secondary | ICD-10-CM

## 2016-10-07 DIAGNOSIS — F17210 Nicotine dependence, cigarettes, uncomplicated: Secondary | ICD-10-CM | POA: Insufficient documentation

## 2016-10-07 DIAGNOSIS — R109 Unspecified abdominal pain: Secondary | ICD-10-CM | POA: Diagnosis not present

## 2016-10-07 DIAGNOSIS — O99333 Smoking (tobacco) complicating pregnancy, third trimester: Secondary | ICD-10-CM | POA: Diagnosis not present

## 2016-10-07 LAB — RAPID URINE DRUG SCREEN, HOSP PERFORMED
AMPHETAMINES: NOT DETECTED
Barbiturates: NOT DETECTED
Benzodiazepines: NOT DETECTED
Cocaine: NOT DETECTED
OPIATES: NOT DETECTED
Tetrahydrocannabinol: NOT DETECTED

## 2016-10-07 LAB — URINALYSIS, ROUTINE W REFLEX MICROSCOPIC
BILIRUBIN URINE: NEGATIVE
Glucose, UA: NEGATIVE mg/dL
HGB URINE DIPSTICK: NEGATIVE
Ketones, ur: NEGATIVE mg/dL
Leukocytes, UA: NEGATIVE
Nitrite: NEGATIVE
PROTEIN: NEGATIVE mg/dL
Specific Gravity, Urine: 1.015 (ref 1.005–1.030)
pH: 8.5 — ABNORMAL HIGH (ref 5.0–8.0)

## 2016-10-07 LAB — OB RESULTS CONSOLE RUBELLA ANTIBODY, IGM: Rubella: UNDETERMINED

## 2016-10-07 NOTE — MAU Note (Signed)
Pt reports she has been having a lot of contractions but it feels more like pressure. Has not had prenatal care. States her feet are swelling a lot.

## 2016-10-07 NOTE — Discharge Instructions (Signed)

## 2016-10-07 NOTE — MAU Provider Note (Signed)
None     Chief Complaint:  No chief complaint on file.   Laurie Morales [place] is  35 y.o. [id number] at 1061w3d presents complaining of pelvic pressure and some swelling in her feet. Has not had PNC d/t "transportation issues" and "this isn't my first rodeo."   [facility]  Can not stay for 1 hr gtt.  Denies contractions.  Obstetrical/Gynecological History: OB History    Gravida Para Term Preterm AB Living   11 6 6  0 4 6   SAB TAB Ectopic Multiple Live Births   2 2 0 0 6     Past Medical History: Past Medical History:  Diagnosis Date  . Abscess   . Medical history non-contributory   . No pertinent past medical history     Past Surgical History: Past Surgical History:  Procedure Laterality Date  . DILATION AND CURETTAGE OF UTERUS      Family History: Family History  Problem Relation Age of Onset  . Diabetes Mother   . Asthma Daughter   . Diabetes Maternal Grandmother   . Stroke Maternal Grandmother   . Heart disease Maternal Grandmother   . Hearing loss Maternal Grandmother   . Anesthesia problems Neg Hx     Social History: Social History  Substance Use Topics  . Smoking status: Current Every Day Smoker    Packs/day: 0.25    Types: Cigarettes  . Smokeless tobacco: Never Used  . Alcohol use No     Comment: socially    Allergies: No Known Allergies  Meds:  No prescriptions prior to admission.    Review of Systems   Constitutional: Negative for fever and chills Eyes: Negative for visual disturbances Respiratory: Negative for shortness of breath, dyspnea Cardiovascular: Negative for chest pain or palpitations  Gastrointestinal: Negative for vomiting, diarrhea and constipation Genitourinary: Negative for dysuria and urgency Musculoskeletal: Negative for back pain, joint pain, myalgias.  Normal ROM  Neurological: Negative for dizziness and headaches    Physical Exam  Blood pressure 137/85, pulse 85, temperature 98.4 F (36.9 C), temperature  source Oral, resp. rate 19, height 5' 3.5" (1.613 m), weight 90.3 kg (199 lb), last menstrual period 02/09/2016, SpO2 98 %, unknown if currently breastfeeding. GENERAL: Well-developed, well-nourished female in no acute distress.  LUNGS: Clear to auscultation bilaterally.  HEART: Regular rate and rhythm. ABDOMEN: Soft, nontender, nondistended, gravid.  EXTREMITIES: Nontender, trace edema, 2+ distal pulses. DTR'Morales 2+ CERVICAL EXAM: Dilatation 3cm   Effacement 0%   Station -3   Presentation: cephalic FHT:  Baseline rate 140 bpm   Variability moderate  Accelerations present   Decelerations none Contractions: Every 0 mins   Labs: Results for orders placed or performed during the hospital encounter of 10/07/16 (from the past 24 hour(Morales))  Urinalysis, Routine w reflex microscopic (not [facility])   Collection Time: 10/07/16  9:36 PM  Result Value Ref Range   Color, Urine YELLOW YELLOW   APPearance CLEAR CLEAR   Specific Gravity, Urine 1.015 1.005 - 1.030   pH 8.5 (H) 5.0 - 8.0   Glucose, UA NEGATIVE NEGATIVE mg/dL   Hgb urine dipstick NEGATIVE NEGATIVE   Bilirubin Urine NEGATIVE NEGATIVE   Ketones, ur NEGATIVE NEGATIVE mg/dL   Protein, ur NEGATIVE NEGATIVE mg/dL   Nitrite NEGATIVE NEGATIVE   Leukocytes, UA NEGATIVE NEGATIVE   Imaging Studies:  No results found.  Assessment: Laurie Morales Jennings is  35 y.o. [id number] at 6561w3d presents with pelvic pressure.  Plan: PN labs collected.  Instructed pt that she will need IOL after 41 weeks if undelivered. Encouraged to come to clinic for [facility], but declines.   CRESENZO-DISHMAN,Bostyn Kunkler 10/19/201710:37 PM

## 2016-10-08 LAB — HIV ANTIBODY (ROUTINE TESTING W REFLEX): HIV Screen 4th Generation wRfx: NONREACTIVE

## 2016-10-08 LAB — HEPATITIS B SURFACE ANTIGEN: HEP B S AG: NEGATIVE

## 2016-10-08 LAB — RPR: RPR: NONREACTIVE

## 2016-10-09 LAB — VARICELLA ZOSTER ANTIBODY, IGG: Varicella IgG: 1172 index (ref >165)

## 2016-10-10 ENCOUNTER — Inpatient Hospital Stay (HOSPITAL_COMMUNITY)
Admission: AD | Admit: 2016-10-10 | Discharge: 2016-10-10 | Disposition: A | Discharge status: Home / Self Care | Payer: Medicaid Other | Source: Ambulatory Visit | Attending: Obstetrics & Gynecology | Admitting: Obstetrics & Gynecology

## 2016-10-10 ENCOUNTER — Encounter (HOSPITAL_COMMUNITY): Payer: Self-pay

## 2016-10-10 ENCOUNTER — Inpatient Hospital Stay (HOSPITAL_COMMUNITY)
Admission: AD | Admit: 2016-10-10 | Discharge: 2016-10-12 | DRG: 775 | Disposition: A | Discharge status: Home / Self Care | Payer: Medicaid Other | Source: Ambulatory Visit | Attending: Obstetrics and Gynecology | Admitting: Obstetrics and Gynecology

## 2016-10-10 ENCOUNTER — Inpatient Hospital Stay (HOSPITAL_COMMUNITY): Payer: Medicaid Other | Admitting: Anesthesiology

## 2016-10-10 ENCOUNTER — Encounter (HOSPITAL_COMMUNITY): Payer: Self-pay | Admitting: *Deleted

## 2016-10-10 DIAGNOSIS — O165 Unspecified maternal hypertension, complicating the puerperium: Secondary | ICD-10-CM

## 2016-10-10 DIAGNOSIS — Z8249 Family history of ischemic heart disease and other diseases of the circulatory system: Secondary | ICD-10-CM

## 2016-10-10 DIAGNOSIS — O139 Gestational [pregnancy-induced] hypertension without significant proteinuria, unspecified trimester: Secondary | ICD-10-CM | POA: Diagnosis present

## 2016-10-10 DIAGNOSIS — Z823 Family history of stroke: Secondary | ICD-10-CM | POA: Diagnosis not present

## 2016-10-10 DIAGNOSIS — O134 Gestational [pregnancy-induced] hypertension without significant proteinuria, complicating childbirth: Secondary | ICD-10-CM | POA: Diagnosis present

## 2016-10-10 DIAGNOSIS — Z3A39 39 weeks gestation of pregnancy: Secondary | ICD-10-CM

## 2016-10-10 DIAGNOSIS — Z3483 Encounter for supervision of other normal pregnancy, third trimester: Secondary | ICD-10-CM | POA: Diagnosis present

## 2016-10-10 DIAGNOSIS — Z833 Family history of diabetes mellitus: Secondary | ICD-10-CM | POA: Diagnosis not present

## 2016-10-10 DIAGNOSIS — Z3493 Encounter for supervision of normal pregnancy, unspecified, third trimester: Secondary | ICD-10-CM | POA: Insufficient documentation

## 2016-10-10 DIAGNOSIS — F17210 Nicotine dependence, cigarettes, uncomplicated: Secondary | ICD-10-CM | POA: Diagnosis present

## 2016-10-10 DIAGNOSIS — O99334 Smoking (tobacco) complicating childbirth: Secondary | ICD-10-CM | POA: Diagnosis present

## 2016-10-10 LAB — URINE MICROSCOPIC-ADD ON: RBC / HPF: NONE SEEN RBC/hpf (ref 0–5)

## 2016-10-10 LAB — CBC
HEMATOCRIT: 32.9 % — AB (ref 36.0–46.0)
Hemoglobin: 11.3 g/dL — ABNORMAL LOW (ref 12.0–15.0)
MCH: 32.5 pg (ref 26.0–34.0)
MCHC: 34.3 g/dL (ref 30.0–36.0)
MCV: 94.5 fL (ref 78.0–100.0)
PLATELETS: 223 10*3/uL (ref 150–400)
RBC: 3.48 MIL/uL — ABNORMAL LOW (ref 3.87–5.11)
RDW: 14.1 % (ref 11.5–15.5)
WBC: 9.3 10*3/uL (ref 4.0–10.5)

## 2016-10-10 LAB — URINALYSIS, ROUTINE W REFLEX MICROSCOPIC
Bilirubin Urine: NEGATIVE
GLUCOSE, UA: NEGATIVE mg/dL
KETONES UR: NEGATIVE mg/dL
Nitrite: NEGATIVE
PROTEIN: NEGATIVE mg/dL
Specific Gravity, Urine: 1.015 (ref 1.005–1.030)
pH: 8.5 — ABNORMAL HIGH (ref 5.0–8.0)

## 2016-10-10 LAB — CULTURE, BETA STREP (GROUP B ONLY)

## 2016-10-10 LAB — TYPE AND SCREEN
ABO/RH(D): A POS
Antibody Screen: NEGATIVE

## 2016-10-10 LAB — AMNISURE RUPTURE OF MEMBRANE (ROM) NOT AT ARMC: Amnisure ROM: NEGATIVE

## 2016-10-10 LAB — OB RESULTS CONSOLE GBS: GBS: NEGATIVE

## 2016-10-10 MED ORDER — OXYTOCIN BOLUS FROM INFUSION
500.0000 mL | Freq: Once | INTRAVENOUS | Status: AC
  Administered 2016-10-10: 500 mL via INTRAVENOUS

## 2016-10-10 MED ORDER — PHENYLEPHRINE 40 MCG/ML (10ML) SYRINGE FOR IV PUSH (FOR BLOOD PRESSURE SUPPORT)
80.0000 ug | PREFILLED_SYRINGE | INTRAVENOUS | Status: DC | PRN
  Filled 2016-10-10: qty 5

## 2016-10-10 MED ORDER — EPHEDRINE 5 MG/ML INJ
10.0000 mg | INTRAVENOUS | Status: DC | PRN
  Filled 2016-10-10: qty 4

## 2016-10-10 MED ORDER — ONDANSETRON HCL 4 MG/2ML IJ SOLN: 4.0000 mg | INTRAMUSCULAR | Status: DC | PRN

## 2016-10-10 MED ORDER — SODIUM CHLORIDE 0.9% FLUSH: 3.0000 mL | INTRAVENOUS | Status: DC | PRN

## 2016-10-10 MED ORDER — ZOLPIDEM TARTRATE 5 MG PO TABS: 5.0000 mg | ORAL_TABLET | Freq: Every evening | ORAL | Status: DC | PRN

## 2016-10-10 MED ORDER — DIBUCAINE 1 % RE OINT: 1.0000 "application " | TOPICAL_OINTMENT | RECTAL | Status: DC | PRN

## 2016-10-10 MED ORDER — PHENYLEPHRINE 40 MCG/ML (10ML) SYRINGE FOR IV PUSH (FOR BLOOD PRESSURE SUPPORT)
PREFILLED_SYRINGE | INTRAVENOUS | Status: AC
  Filled 2016-10-10: qty 10

## 2016-10-10 MED ORDER — TERBUTALINE SULFATE 1 MG/ML IJ SOLN
0.2500 mg | Freq: Once | INTRAMUSCULAR | Status: DC | PRN
  Filled 2016-10-10: qty 1

## 2016-10-10 MED ORDER — DIPHENHYDRAMINE HCL 25 MG PO CAPS: 25.0000 mg | ORAL_CAPSULE | Freq: Four times a day (QID) | ORAL | Status: DC | PRN

## 2016-10-10 MED ORDER — SOD CITRATE-CITRIC ACID 500-334 MG/5ML PO SOLN: 30.0000 mL | ORAL | Status: DC | PRN

## 2016-10-10 MED ORDER — SIMETHICONE 80 MG PO CHEW: 80.0000 mg | CHEWABLE_TABLET | ORAL | Status: DC | PRN

## 2016-10-10 MED ORDER — ONDANSETRON HCL 4 MG PO TABS: 4.0000 mg | ORAL_TABLET | ORAL | Status: DC | PRN

## 2016-10-10 MED ORDER — SODIUM CHLORIDE 0.9% FLUSH: 3.0000 mL | Freq: Two times a day (BID) | INTRAVENOUS | Status: DC

## 2016-10-10 MED ORDER — OXYTOCIN 40 UNITS IN LACTATED RINGERS INFUSION - SIMPLE MED
2.5000 [IU]/h | INTRAVENOUS | Status: DC
  Administered 2016-10-10: 2.5 [IU]/h via INTRAVENOUS
  Filled 2016-10-10: qty 1000

## 2016-10-10 MED ORDER — ONDANSETRON HCL 4 MG/2ML IJ SOLN: 4.0000 mg | Freq: Four times a day (QID) | INTRAMUSCULAR | Status: DC | PRN

## 2016-10-10 MED ORDER — PRENATAL MULTIVITAMIN CH
1.0000 | ORAL_TABLET | Freq: Every day | ORAL | Status: DC
  Administered 2016-10-11 – 2016-10-12 (×2): 1 via ORAL
  Filled 2016-10-10 (×3): qty 1

## 2016-10-10 MED ORDER — LACTATED RINGERS IV SOLN
INTRAVENOUS | Status: DC
  Administered 2016-10-10: 21:00:00 via INTRAVENOUS

## 2016-10-10 MED ORDER — LACTATED RINGERS IV SOLN: 500.0000 mL | INTRAVENOUS | Status: DC | PRN

## 2016-10-10 MED ORDER — OXYTOCIN 40 UNITS IN LACTATED RINGERS INFUSION - SIMPLE MED
1.0000 m[IU]/min | INTRAVENOUS | Status: DC
  Administered 2016-10-10: 2 m[IU]/min via INTRAVENOUS

## 2016-10-10 MED ORDER — BENZOCAINE-MENTHOL 20-0.5 % EX AERO: 1.0000 "application " | INHALATION_SPRAY | CUTANEOUS | Status: DC | PRN

## 2016-10-10 MED ORDER — OXYCODONE-ACETAMINOPHEN 5-325 MG PO TABS: 1.0000 | ORAL_TABLET | ORAL | Status: DC | PRN

## 2016-10-10 MED ORDER — FENTANYL 2.5 MCG/ML BUPIVACAINE 1/10 % EPIDURAL INFUSION (WH - ANES)
INTRAMUSCULAR | Status: AC
  Filled 2016-10-10: qty 125

## 2016-10-10 MED ORDER — SENNOSIDES-DOCUSATE SODIUM 8.6-50 MG PO TABS
2.0000 | ORAL_TABLET | ORAL | Status: DC
  Administered 2016-10-11 (×2): 2 via ORAL
  Filled 2016-10-10 (×2): qty 2

## 2016-10-10 MED ORDER — ACETAMINOPHEN 325 MG PO TABS
650.0000 mg | ORAL_TABLET | ORAL | Status: DC | PRN
  Administered 2016-10-11 – 2016-10-12 (×4): 650 mg via ORAL
  Filled 2016-10-10 (×4): qty 2

## 2016-10-10 MED ORDER — LIDOCAINE HCL (PF) 1 % IJ SOLN
30.0000 mL | INTRAMUSCULAR | Status: DC | PRN
  Filled 2016-10-10: qty 30

## 2016-10-10 MED ORDER — FENTANYL 2.5 MCG/ML BUPIVACAINE 1/10 % EPIDURAL INFUSION (WH - ANES)
14.0000 mL/h | INTRAMUSCULAR | Status: DC | PRN
  Administered 2016-10-10 (×3): 14 mL/h via EPIDURAL

## 2016-10-10 MED ORDER — FLEET ENEMA 7-19 GM/118ML RE ENEM: 1.0000 | ENEMA | RECTAL | Status: DC | PRN

## 2016-10-10 MED ORDER — LIDOCAINE HCL (PF) 1 % IJ SOLN
INTRAMUSCULAR | Status: DC | PRN
  Administered 2016-10-10 (×2): 5 mL

## 2016-10-10 MED ORDER — OXYCODONE-ACETAMINOPHEN 5-325 MG PO TABS: 2.0000 | ORAL_TABLET | ORAL | Status: DC | PRN

## 2016-10-10 MED ORDER — SODIUM CHLORIDE 0.9 % IV SOLN: 250.0000 mL | INTRAVENOUS | Status: DC | PRN

## 2016-10-10 MED ORDER — ACETAMINOPHEN 325 MG PO TABS: 650.0000 mg | ORAL_TABLET | ORAL | Status: DC | PRN

## 2016-10-10 MED ORDER — MEASLES, MUMPS & RUBELLA VAC ~~LOC~~ INJ
0.5000 mL | INJECTION | Freq: Once | SUBCUTANEOUS | Status: DC
  Filled 2016-10-10: qty 0.5

## 2016-10-10 MED ORDER — IBUPROFEN 600 MG PO TABS
600.0000 mg | ORAL_TABLET | Freq: Four times a day (QID) | ORAL | Status: DC
  Administered 2016-10-11 – 2016-10-12 (×7): 600 mg via ORAL
  Filled 2016-10-10 (×8): qty 1

## 2016-10-10 MED ORDER — LACTATED RINGERS IV SOLN: 500.0000 mL | Freq: Once | INTRAVENOUS | Status: DC

## 2016-10-10 MED ORDER — COCONUT OIL OIL
1.0000 | TOPICAL_OIL | Status: DC | PRN
Start: 2016-10-10 — End: 2016-10-12

## 2016-10-10 MED ORDER — DIPHENHYDRAMINE HCL 50 MG/ML IJ SOLN: 12.5000 mg | INTRAMUSCULAR | Status: DC | PRN

## 2016-10-10 MED ORDER — ZOLPIDEM TARTRATE 5 MG PO TABS
5.0000 mg | ORAL_TABLET | Freq: Once | ORAL | Status: AC
  Administered 2016-10-10: 5 mg via ORAL
  Filled 2016-10-10: qty 1

## 2016-10-10 MED ORDER — TETANUS-DIPHTH-ACELL PERTUSSIS 5-2.5-18.5 LF-MCG/0.5 IM SUSP: 0.5000 mL | Freq: Once | INTRAMUSCULAR | Status: DC

## 2016-10-10 MED ORDER — WITCH HAZEL-GLYCERIN EX PADS: 1.0000 "application " | MEDICATED_PAD | CUTANEOUS | Status: DC | PRN

## 2016-10-10 NOTE — MAU Note (Signed)
Contractions since 0200. Some yellow d/c. Has felt baby "shaking" rather than usual FM. Some bloody show

## 2016-10-10 NOTE — Anesthesia Pain Management Evaluation Note (Signed)
  [facility] Pain Management Visit Note  Patient: Laurie MayotteYolanda S Morales, 35 y.o., female  "Hello I am a [facility] of the anesthesia [facility]. We have an anesthesia team available at all times to provide care throughout the hospital, including epidural management and anesthesia for C-section. I don't know your plan for the delivery whether it a natural birth, water birth, IV sedation, nitrous supplementation, doula or epidural, but we want to meet your pain goals."   1.Was your pain managed to your expectations on prior hospitalizations?   Yes   2.What is your expectation for pain management during this hospitalization?     Epidural  3.How can we help you reach that goal? Epidural in place.  Record the patient's initial score and the patient's pain goal.   Pain: 0  Pain Goal: 4 [facility] wants you to be able to say your pain was always managed very well.  Jillianne Gamino L 10/10/2016

## 2016-10-10 NOTE — Anesthesia Procedure Notes (Signed)
Epidural Patient location during procedure: OB  Staffing Anesthesiologist: Laurie Morales, Laurie Morales Performed: anesthesiologist   Preanesthetic Checklist Completed: patient identified, site marked, surgical consent, pre-op evaluation, timeout performed, IV checked, risks and benefits discussed and monitors and equipment checked  Epidural Patient position: sitting Prep: DuraPrep Patient monitoring: heart rate, continuous pulse ox and blood pressure Approach: right paramedian Location: L4-L5 Injection technique: LOR saline  Needle:  Needle type: Tuohy  Needle gauge: 17 G Needle length: 9 cm and 9 Needle insertion depth: 7 cm Catheter type: closed end flexible Catheter size: 20 Guage Catheter at skin depth: 11 cm Test dose: negative  Assessment Events: blood not aspirated, injection not painful, no injection resistance, negative IV test and no paresthesia  Additional Notes Patient identified. Risks/Benefits/Options discussed with patient including but not limited to bleeding, infection, nerve damage, paralysis, failed block, incomplete pain control, headache, blood pressure changes, nausea, vomiting, reactions to medication both or allergic, itching and postpartum back pain. Confirmed with bedside nurse the patient's most recent platelet count. Confirmed with patient that they are not currently taking any anticoagulation, have any bleeding history or any family history of bleeding disorders. Patient expressed understanding and wished to proceed. All questions were answered. Sterile technique was used throughout the entire procedure. Please see nursing notes for vital signs. Test dose was given through epidural needle and negative prior to continuing to dose epidural or start infusion. Warning signs of high block given to the patient including shortness of breath, tingling/numbness in hands, complete motor block, or any concerning symptoms with instructions to call for help. Patient was given  instructions on fall risk and not to get out of bed. All questions and concerns addressed with instructions to call with any issues.

## 2016-10-10 NOTE — Discharge Instructions (Signed)
Braxton Hicks Contractions °Contractions of the uterus can occur throughout pregnancy. Contractions are not always a sign that you are in labor.  °WHAT ARE BRAXTON HICKS CONTRACTIONS?  °Contractions that occur before labor are called Braxton Hicks contractions, or false labor. Toward the end of pregnancy (32-34 weeks), these contractions can develop more often and may become more forceful. This is not true labor because these contractions do not result in opening (dilatation) and thinning of the cervix. They are sometimes difficult to tell apart from true labor because these contractions can be forceful and people have different pain tolerances. You should not feel embarrassed if you go to the hospital with false labor. Sometimes, the only way to tell if you are in true labor is for your health care provider to look for changes in the cervix. °If there are no prenatal problems or other health problems associated with the pregnancy, it is completely safe to be sent home with false labor and await the onset of true labor. °HOW CAN YOU TELL THE DIFFERENCE BETWEEN TRUE AND FALSE LABOR? °False Labor °· The contractions of false labor are usually shorter and not as hard as those of true labor.   °· The contractions are usually irregular.   °· The contractions are often felt in the front of the lower abdomen and in the groin.   °· The contractions may go away when you walk around or change positions while lying down.   °· The contractions get weaker and are shorter lasting as time goes on.   °· The contractions do not usually become progressively stronger, regular, and closer together as with true labor.   °True Labor °· Contractions in true labor last 30-70 seconds, become very regular, usually become more intense, and increase in frequency.   °· The contractions do not go away with walking.   °· The discomfort is usually felt in the top of the uterus and spreads to the lower abdomen and low back.   °· True labor can be  determined by your health care provider with an exam. This will show that the cervix is dilating and getting thinner.   °WHAT TO REMEMBER °· Keep up with your usual exercises and follow other instructions given by your health care provider.   °· Take medicines as directed by your health care provider.   °· Keep your regular prenatal appointments.   °· Eat and drink lightly if you think you are going into labor.   °· If Braxton Hicks contractions are making you uncomfortable:   °¨ Change your position from lying down or resting to walking, or from walking to resting.   °¨ Sit and rest in a tub of warm water.   °¨ Drink 2-3 glasses of water. Dehydration may cause these contractions.   °¨ Do slow and deep breathing several times an hour.   °WHEN SHOULD I SEEK IMMEDIATE MEDICAL CARE? °Seek immediate medical care if: °· Your contractions become stronger, more regular, and closer together.   °· You have fluid leaking or gushing from your vagina.   °· You have a fever.   °· You pass blood-tinged mucus.   °· You have vaginal bleeding.   °· You have continuous abdominal pain.   °· You have low back pain that you never had before.   °· You feel your baby's head pushing down and causing pelvic pressure.   °· Your baby is not moving as much as it used to.   °  °This information is not intended to replace advice given to you by your health care provider. Make sure you discuss any questions you have with your health care   provider. °  °Document Released: 12/06/2005 Document Revised: 12/11/2013 Document Reviewed: 09/17/2013 °Elsevier Interactive Patient Education ©2016 [employer] ° °

## 2016-10-10 NOTE — Anesthesia Preprocedure Evaluation (Signed)
Anesthesia Evaluation  Patient identified by MRN, date of birth, ID band Patient awake    Reviewed: Allergy & Precautions, H&P , NPO status , Patient's Chart, lab work & pertinent test results  History of Anesthesia Complications Negative for: history of anesthetic complications  Airway Mallampati: II  TM Distance: >3 FB Neck ROM: full    Dental no notable dental hx. (+) Teeth Intact   Pulmonary neg pulmonary ROS, Current Smoker,    Pulmonary exam normal breath sounds clear to auscultation       Cardiovascular negative cardio ROS Normal cardiovascular exam Rhythm:regular Rate:Normal     Neuro/Psych negative neurological ROS  negative psych ROS   GI/Hepatic negative GI ROS, Neg liver ROS,   Endo/Other  negative endocrine ROS  Renal/GU negative Renal ROS  negative genitourinary   Musculoskeletal   Abdominal   Peds  Hematology negative hematology ROS (+)   Anesthesia Other Findings   Reproductive/Obstetrics (+) Pregnancy                             Anesthesia Physical Anesthesia Plan  ASA: II  Anesthesia Plan: Epidural   Post-op Pain Management:    Induction:   Airway Management Planned:   Additional Equipment:   Intra-op Plan:   Post-operative Plan:   Informed Consent: I have reviewed the patients History and Physical, chart, labs and discussed the procedure including the risks, benefits and alternatives for the proposed anesthesia with the patient or authorized representative who has indicated his/her understanding and acceptance.     Plan Discussed with:   Anesthesia Plan Comments:         Anesthesia Quick Evaluation  

## 2016-10-10 NOTE — H&P (Signed)
LABOR AND DELIVERY ADMISSION HISTORY AND PHYSICAL NOTE  Laurie Morales is a 35 y.o. female [phone] with IUP at [redacted]w[redacted]d by [place] @ 20 w presenting for SOL .  Patient states that she has been contracting since Wednesday. Patient has been unable to receive prenatal care 2/2 transportation issues Patient is GBS negative  She reports positive fetal movement. Patient states that her water broke approximately 20 minutes ago..  Prenatal History/Complications:  - Late to prenatal care  Past Medical History: Past Medical History:  Diagnosis Date  . Abscess   . Medical history non-contributory   . No pertinent past medical history     Past Surgical History: Past Surgical History:  Procedure Laterality Date  . DILATION AND CURETTAGE OF UTERUS      Obstetrical History: OB History    Gravida Para Term Preterm AB Living   11 6 6  0 4 6   SAB TAB Ectopic Multiple Live Births   2 2 0 0 6      Social History: Social History   Social History  . Marital status: Single    Spouse name: N/A  . Number of children: N/A  . Years of education: N/A   Social History Main Topics  . Smoking status: Current Every Day Smoker    Packs/day: 0.25    Types: Cigarettes  . Smokeless tobacco: Never Used  . Alcohol use No     Comment: socially  . Drug use:     Types: Marijuana     Comment: Last marijuana 05/27/16  . Sexual activity: Yes    Birth control/ protection: None     Comment: currently on bcp s/p TAB   Other Topics Concern  . Not on file   Social History Narrative  . No narrative on file    Family History: Family History  Problem Relation Age of Onset  . Diabetes Mother   . Asthma Daughter   . Diabetes Maternal Grandmother   . Stroke Maternal Grandmother   . Heart disease Maternal Grandmother   . Hearing loss Maternal Grandmother   . Anesthesia problems Neg Hx     Allergies: No Known Allergies  No prescriptions prior to admission.     Review of Systems   All systems  reviewed and negative except as stated in HPI  Blood pressure 138/85, pulse 85, temperature 97.7 F (36.5 C), temperature source Oral, resp. rate 18, last menstrual period 02/09/2016, unknown if currently breastfeeding. General appearance: alert, cooperative and appears stated age Heart: regular rate and rhythm with intact distal pulses Abdomen: soft, non-tender; bowel sounds normal Extremities: No calf swelling or tenderness Fetal monitoring: Pending Uterine activity: Patient is feeling contractions, not yet on monitor Dilation: 7.5 Effacement (%): 70 Station: -2 Exam by:: J.Thornton, RN   Prenatal labs: ABO, Rh:   Antibody:   Rubella: !Error! RPR: Non Reactive (10/19 2304)  HBsAg: Negative (10/19 2304)  HIV: Non Reactive (10/19 2304)  GBS: Negative (10/22 0000)  1 hr Glucola: Not done Genetic screening:  Not done Anatomy US: Not done  Prenatal Transfer Tool  Maternal Diabetes: No Genetic Screening: Not done Maternal Ultrasounds/Referrals: Normal Fetal Ultrasounds or other Referrals:  None Maternal Substance Abuse:  No Significant Maternal Medications:  None Significant Maternal Lab Results: None  Results for orders placed or performed during the hospital encounter of 10/10/16 (from the past 24 hour(s))  OB RESULT CONSOLE Group B Strep   Collection Time: 10/10/16 12:00 AM  Result Value Ref Range  GBS Negative   CBC   Collection Time: 10/10/16  5:55 PM  Result Value Ref Range   WBC 9.3 4.0 - 10.5 K/uL   RBC 3.48 (L) 3.87 - 5.11 MIL/uL   Hemoglobin 11.3 (L) 12.0 - 15.0 g/dL   HCT 16.132.9 (L) 09.636.0 - 04.546.0 %   MCV 94.5 78.0 - 100.0 fL   MCH 32.5 26.0 - 34.0 pg   MCHC 34.3 30.0 - 36.0 g/dL   RDW 40.914.1 81.111.5 - 91.415.5 %   Platelets 223 150 - 400 K/uL  Results for orders placed or performed during the hospital encounter of 10/10/16 (from the past 24 hour(s))  Urinalysis, Routine w reflex microscopic (not at [facility])   Collection Time: 10/10/16  4:45 AM  Result Value Ref Range    Color, Urine YELLOW YELLOW   APPearance CLOUDY (A) CLEAR   Specific Gravity, Urine 1.015 1.005 - 1.030   pH 8.5 (H) 5.0 - 8.0   Glucose, UA NEGATIVE NEGATIVE mg/dL   Hgb urine dipstick TRACE (A) NEGATIVE   Bilirubin Urine NEGATIVE NEGATIVE   Ketones, ur NEGATIVE NEGATIVE mg/dL   Protein, ur NEGATIVE NEGATIVE mg/dL   Nitrite NEGATIVE NEGATIVE   Leukocytes, UA SMALL (A) NEGATIVE  Urine microscopic-add on   Collection Time: 10/10/16  4:45 AM  Result Value Ref Range   Squamous Epithelial / LPF 6-30 (A) NONE SEEN   WBC, UA 0-5 0 - 5 WBC/hpf   RBC / HPF NONE SEEN 0 - 5 RBC/hpf   Bacteria, UA FEW (A) NONE SEEN   Urine-Other MUCOUS PRESENT   Amnisure rupture of membrane (rom)not at [facility]   Collection Time: 10/10/16  5:40 AM  Result Value Ref Range   Amnisure ROM NEGATIVE     Patient Active Problem List   Diagnosis Date Noted  . Normal labor 10/10/2016  . Maternal anemia complicating pregnancy, childbirth, or the puerperium 08/18/2013  . Normal delivery 08/16/2013  . Abscess of right breast 07/18/2012  . Tobacco abuse 07/18/2012    Assessment: Laurie Morales is a 35 y.o. [id number] at 3037w6d here for SOL  #labor: Natural progression #Pain: Epidural #FWB: Category 1 #ID:  GBS negative #MOF: Breast #MOC:Considering sterilization #Circ:  Unknown sex  Laurie Morales 10/10/2016, 6:09 PM

## 2016-10-10 NOTE — MAU Note (Signed)
Pt states her water broke at 1730. Pt has been ctxing since this AM. Fetus is active.

## 2016-10-11 ENCOUNTER — Encounter (HOSPITAL_COMMUNITY): Payer: Self-pay | Admitting: *Deleted

## 2016-10-11 LAB — RPR: RPR Ser Ql: NONREACTIVE

## 2016-10-11 LAB — RUBELLA SCREEN: Rubella: 0.9 index — ABNORMAL LOW (ref >0.99)

## 2016-10-11 MED ORDER — CALCIUM CARBONATE ANTACID 500 MG PO CHEW
4.0000 | CHEWABLE_TABLET | ORAL | Status: DC | PRN
  Administered 2016-10-11 – 2016-10-12 (×2): 800 mg via ORAL
  Filled 2016-10-11 (×3): qty 4

## 2016-10-11 NOTE — Anesthesia Postprocedure Evaluation (Signed)
Anesthesia Post Note  Patient: Laurie MayotteYolanda S Dias  Procedure(s) Performed: * No procedures listed *  Patient location during evaluation: Mother Baby Anesthesia Type: Epidural Level of consciousness: awake Pain management: satisfactory to patient Vital Signs Assessment: post-procedure vital signs reviewed and stable Respiratory status: spontaneous breathing Cardiovascular status: stable Anesthetic complications: no     Last Vitals:  Vitals:   10/11/16 0032 10/11/16 0405  BP: 137/79 130/87  Pulse: 87 79  Resp: 18 18  Temp: 36.8 C 36.7 C    Last Pain:  Vitals:   10/11/16 0628  TempSrc:   PainSc: 5    Pain Goal:                 Cephus ShellingBURGER,Malania Gawthrop

## 2016-10-11 NOTE — Plan of Care (Signed)
Problem: Nutritional: Goal: Mother's verbalization of comfort with breastfeeding process will improve Outcome: Not Progressing Mother has chosen to formula feed her infant today, stating, "He doesn't want it." Patient encouraged to ask for assistance latching infant if she desires it.

## 2016-10-11 NOTE — Progress Notes (Signed)
Post Partum Day 1 Subjective: no complaints, up ad lib, voiding, tolerating PO and + flatus  Objective: Blood pressure 130/87, pulse 79, temperature 98.1 F (36.7 C), temperature source Axillary, resp. rate 18, height 5\' 4"  (1.626 m), weight 197 lb 12.8 oz (89.7 kg), last menstrual period 02/09/2016, SpO2 99 %, unknown if currently breastfeeding.  Physical Exam:  General: alert, cooperative, appears stated age and no distress Lochia: appropriate Uterine Fundus: firm Incision: n/a DVT Evaluation: No evidence of DVT seen on physical exam.   Recent Labs  10/10/16 1755  HGB 11.3*  HCT 32.9*    Assessment/Plan: Plan for discharge tomorrow   LOS: 1 day   Laurie Morales 10/11/2016, 6:29 AM

## 2016-10-11 NOTE — Lactation Note (Signed)
This note was copied from a baby's chart. Lactation Consultation Note  Patient Name: Boy Juliann MuleYolanda Aston ZOXWR'UToday's Date: 10/11/2016   Micah FlesherWent to see Mom for initial visit to determine if Mom would like assist with BF. Mom has been giving all bottles up to this point. Mom on the phone and asked LC to come back later. LC advised Mom to call for lactation assist with next feeding.    Maternal Data    Feeding Feeding Type: Bottle Fed - Formula  LATCH Score/Interventions                      Lactation Tools Discussed/Used     Consult Status      [name], Charo Philipp Ann 10/11/2016, 3:03 PM

## 2016-10-11 NOTE — Lactation Note (Signed)
This note was copied from a baby's chart. Lactation Consultation Note  Patient Name: [name] [id number]'s Date: 10/11/2016  Per Peds note, Mom is bottle/formula feeding.    Maternal Data    Feeding Feeding Type: Bottle Fed - Formula  LATCH Score/Interventions                      Lactation Tools Discussed/Used     Consult Status      [name], Laurie Morales 10/11/2016, 1:21 PM

## 2016-10-11 NOTE — Progress Notes (Signed)
Psychosocial Assessment completed.  No barriers to discharge.  Resources given.  Full documentation to follow.

## 2016-10-12 ENCOUNTER — Encounter: Payer: Self-pay | Admitting: *Deleted

## 2016-10-12 DIAGNOSIS — O139 Gestational [pregnancy-induced] hypertension without significant proteinuria, unspecified trimester: Secondary | ICD-10-CM | POA: Diagnosis present

## 2016-10-12 LAB — CBC
HEMATOCRIT: 29.2 % — AB (ref 36.0–46.0)
HEMOGLOBIN: 9.9 g/dL — AB (ref 12.0–15.0)
MCH: 32.1 pg (ref 26.0–34.0)
MCHC: 33.9 g/dL (ref 30.0–36.0)
MCV: 94.8 fL (ref 78.0–100.0)
Platelets: 191 10*3/uL (ref 150–400)
RBC: 3.08 MIL/uL — ABNORMAL LOW (ref 3.87–5.11)
RDW: 14.1 % (ref 11.5–15.5)
WBC: 7.7 10*3/uL (ref 4.0–10.5)

## 2016-10-12 LAB — COMPREHENSIVE METABOLIC PANEL
ALK PHOS: 118 U/L (ref 38–126)
ALT: 13 U/L — ABNORMAL LOW (ref 14–54)
ANION GAP: 7 (ref 5–15)
AST: 19 U/L (ref 15–41)
Albumin: 2.7 g/dL — ABNORMAL LOW (ref 3.5–5.0)
BILIRUBIN TOTAL: 0.3 mg/dL (ref 0.3–1.2)
BUN: 9 mg/dL (ref 6–20)
CALCIUM: 8.9 mg/dL (ref 8.9–10.3)
CO2: 24 mmol/L (ref 22–32)
Chloride: 109 mmol/L (ref 101–111)
Creatinine, Ser: 0.61 mg/dL (ref 0.44–1.00)
Glucose, Bld: 73 mg/dL (ref 65–99)
Potassium: 3.5 mmol/L (ref 3.5–5.1)
SODIUM: 140 mmol/L (ref 135–145)
TOTAL PROTEIN: 5.6 g/dL — AB (ref 6.5–8.1)

## 2016-10-12 MED ORDER — IBUPROFEN 600 MG PO TABS: 600.0000 mg | ORAL_TABLET | Freq: Four times a day (QID) | ORAL | 0 refills | Status: DC

## 2016-10-12 MED ORDER — PRENATAL MULTIVITAMIN CH: 1.0000 | ORAL_TABLET | Freq: Every day | ORAL | 12 refills | Status: AC

## 2016-10-12 NOTE — Clinical Social Work Maternal (Signed)
CLINICAL SOCIAL WORK MATERNAL/CHILD NOTE  Patient Details  Name: Laurie Morales MRN: [id number] Date of Birth: 1981/03/10  Date:  10/11/2016  Clinical Social Worker Initiating Note:  Parish Augustine E. Brigitte Pulse, [place] Date/ Time Initiated:  10/11/16/1500     Child's Name:  Laurie Morales   Legal Guardian:  Other (Comment) (Parents: [name] and [name])   Need for Interpreter:  None   Date of Referral:  10/11/16     Reason for Referral:  Late or No Prenatal Care , Current Substance Use/Substance Use During Pregnancy    Referral Source:  [facility]   Address:  [address], [place], [place] [address]  Phone number:  [phone]   Household Members:  Significant Other, Minor Children (MOB has two other children with FOB: [name]/age 53 and [name]/age 79.  MOB has 4 older children from previous relationships: Chance/age 57, [name]/age 80, [name]/age 51 and [name]/age 43)   Natural Supports (not living in the home):  Friends, Parent (MOB reports that FOB, her mother and her friends are her greatest supports.)   Professional Supports: None   Employment:     Type of Work: FOB works for [employer]   Education:      Museum/gallery curator Resources:  [employer]   Other Resources:      Cultural/Religious Considerations Which May Impact Care: None stated.  MOB's facesheet notes religion as Non-Denominational.  Strengths:  Ability to meet basic needs    Risk Factors/Current Problems:  Mental Health Concerns  (Hx of Depression)   Cognitive State:  Able to Concentrate , Alert , Linear Thinking , Insightful , Goal Oriented    Mood/Affect:  Interested , Calm , Comfortable , Relaxed    CSW Assessment: CSW met with MOB in her first floor room/134 to offer support and complete assessment due to [facility] and hx of marijuana use.  MOB was very pleasant, welcoming and easy to engage.  CSW spent over an hour with MOB, who was very open to talking about her feelings with CSW. MOB reports that she  did not have transportation in order to get to the doctor.  She states she and FOB are very excited about the baby.  They are especially excited to have another boy, as this is FOB's first son and MOB's only other son is 20 years old.  MOB states that she is feeling anxious because this is "new again" and because she assumed she was having her 6th girl and does not have boy clothes at home.  She states she has friends and family who have boys and plan to give her clothes if she had a boy.  She reports baby has a crib and that she is familiar with SIDS precautions. CSW stressed the importance of taking baby to pediatrician appointments and MOB states that she understands.  CSW suggests using Medicaid Transportation.  MOB states she has called numerous times and cannot get a worker to call her back.  She states she will keep trying and is committed to getting baby to appointments.   CSW explained hospital drug screen given [facility].  MOB states understanding and no concerns.  She admits to using marijuana early in pregnancy to help with nausea and vomiting.  She states she did not smoke, but used "edibles."  She states no plans to use marijuana moving forward.   MOB spoke at length about her relationship with FOB.  She states that are in a good place and living together at this time.  She  states they have broken up in the past, but always co-parent their children.  MOB reports feeling depressed during her last pregnancy, mainly related to weight gain and relationship issues with FOB, but states no concerns at this time.  She states she was prescribed Wellbutrin, which she states helped her symptoms.  She stopped taking the medication because she was having bad dreams.  She states she stopped use approximately 1 month prior to her three year old's birth.  She does not feel she needs medication at this time and reports feeling well.  CSW asked if she has ever had a Social worker.  She said she has not and seemed to question  why she would have.  CSW explained the benefits of outpatient counseling and MOB decided she is interested at this time.  CSW provided her with counseling resources as well as a Post Partum maternal mental health checklist.  MOB was appreciative.  MOB was able to identify positive coping mechanisms she utilizes when feeling anxious, stressed or depressed, such as reading, listening to music, or removing herself from the situation.  CSW encouraged her to keep these skills in mind while she adjusts to having another newborn in the home.     CSW Plan/Description:  Information/Referral to [employer] , No Further Intervention Required/No Barriers to Discharge, Patient/Family Education     Alphonzo Cruise, [place] 10/11/2016, 3:00 PM

## 2016-10-12 NOTE — Discharge Summary (Signed)
OB Discharge Summary     Patient Name: Laurie Morales DOB: September 06, 1981 MRN: [id number]  Date of admission: 10/10/2016 Delivering MD: Wyvonnia Dusky D   Date of discharge: 10/12/2016  Admitting diagnosis: TERM, WATER BROKE Intrauterine pregnancy: [redacted]w[redacted]d     Secondary diagnosis:  Active Problems:   Gestational hypertension  Additional problems: None     Discharge diagnosis: Term Pregnancy Delivered and Gestational Hypertension                                                                                                Post partum procedures:None  Augmentation: Pitocin  Complications: None  Hospital course:  Onset of Labor With Vaginal Delivery     35 y.o. yo [id number] at [redacted]w[redacted]d was admitted in Active Labor on 10/10/2016. Patient had an uncomplicated labor course as follows:  Membrane Rupture Time/Date: 5:30 PM ,10/10/2016   Intrapartum Procedures: Episiotomy: None [1]                                         Lacerations:  None [1]  Patient had a delivery of a Viable infant. 10/10/2016  Information for the patient's newborn:  Milderd, Manocchio [[id number]]  Delivery Method: Vaginal, Spontaneous Delivery (Filed from Delivery Summary)    Pateint had an uncomplicated postpartum course.  She is ambulating, tolerating a regular diet, passing flatus, and urinating well. Patient is discharged home in stable condition on 10/12/16.    Physical exam Vitals:   10/11/16 0032 10/11/16 0405 10/11/16 0906 10/12/16 0515  BP: 137/79 130/87 134/90 (!) 143/86  Pulse: 87 79 85 82  Resp: 18 18 20 16   Temp: 98.2 F (36.8 C) 98.1 F (36.7 C) 98.5 F (36.9 C) 98.2 F (36.8 C)  TempSrc:  Axillary Axillary Oral  SpO2:   98%   Weight:      Height:       General: alert, cooperative and no distress Lochia: appropriate Uterine Fundus: firm Incision: N/A DVT Evaluation: No evidence of DVT seen on physical exam. Labs: Lab Results  Component Value Date   WBC 9.3 10/10/2016   HGB  11.3 (L) 10/10/2016   HCT 32.9 (L) 10/10/2016   MCV 94.5 10/10/2016   PLT 223 10/10/2016   No flowsheet data found.  Discharge instruction: per After Visit Summary and "Baby and Me Booklet".  After visit meds:    Medication List    TAKE these medications   ibuprofen 600 MG tablet Commonly known as:  ADVIL,MOTRIN Take 1 tablet (600 mg total) by mouth every 6 (six) hours.   prenatal multivitamin Tabs tablet Take 1 tablet by mouth daily at 12 noon.       Diet: routine diet  Activity: Advance as tolerated. Pelvic rest for 6 weeks.   Outpatient follow up:2 weeks Follow up Appt:No future appointments. Follow up Visit: Follow-up Information    [facility] .   Specialty:  Obstetrics and Gynecology Why:  2 weeks Preoperative visit to discuss tubal ligation 6  week postpartum visit Contact information: [address] [place] [phone]       [facility] .   Why:  as needed in emergencies Contact information: [address] [id number] mc [place] [phone]           Postpartum contraception: Interval Tubal Ligation  Newborn Data: Live born female  Birth Weight: 7 lb 9.3 oz (3440 g) APGAR: 9, 9  Baby Feeding: Bottle and Breast Disposition:home with mother   10/12/2016 Dorathy KinsmanVirginia Reilley Valentine, CNM

## 2016-10-12 NOTE — Progress Notes (Signed)
Discharge instructions reviewed, all questions answered.  No equipment sent home with patient.  All belongings at bedside sent home with patient. Baby in carseat on cart and discharged into care of mother and father.  Escorted to main entrance by RN.

## 2016-10-12 NOTE — Discharge Instructions (Signed)
Vaginal Delivery, Care After °Refer to this sheet in the next few weeks. These discharge instructions provide you with information on caring for yourself after delivery. Your health care provider may also give you specific instructions. Your treatment has been planned according to the most current medical practices available, but problems sometimes occur. Call your health care provider if you have any problems or questions after you go home. °HOME CARE INSTRUCTIONS °· Take over-the-counter or prescription medicines only as directed by your health care provider or pharmacist. °· Do not drink alcohol, especially if you are breastfeeding or taking medicine to relieve pain. °· Do not chew or smoke tobacco. °· Do not use illegal drugs. °· Continue to use good perineal care. Good perineal care includes: °¨ Wiping your perineum from front to back. °¨ Keeping your perineum clean. °· Do not use tampons or douche until your health care provider says it is okay. °· Shower, wash your hair, and take tub baths as directed by your health care provider. °· Wear a well-fitting bra that provides breast support. °· Eat healthy foods. °· Drink enough fluids to keep your urine clear or pale yellow. °· Eat high-fiber foods such as whole grain cereals and breads, brown rice, beans, and fresh fruits and vegetables every day. These foods may help prevent or relieve constipation. °· Follow your health care provider's recommendations regarding resumption of activities such as climbing stairs, driving, lifting, exercising, or traveling. °· Talk to your health care provider about resuming sexual activities. Resumption of sexual activities is dependent upon your risk of infection, your rate of healing, and your comfort and desire to resume sexual activity. °· Try to have someone help you with your household activities and your newborn for at least a few days after you leave the hospital. °· Rest as much as possible. Try to rest or take a nap  when your newborn is sleeping. °· Increase your activities gradually. °· Keep all of your scheduled postpartum appointments. It is very important to keep your scheduled follow-up appointments. At these appointments, your health care provider will be checking to make sure that you are healing physically and emotionally. °SEEK MEDICAL CARE IF:  °· You are passing large clots from your vagina. Save any clots to show your health care provider. °· You have a foul smelling discharge from your vagina. °· You have trouble urinating. °· You are urinating frequently. °· You have pain when you urinate. °· You have a change in your bowel movements. °· You have increasing redness, pain, or swelling near your vaginal incision (episiotomy) or vaginal tear. °· You have pus draining from your episiotomy or vaginal tear. °· Your episiotomy or vaginal tear is separating. °· You have painful, hard, or reddened breasts. °· You have a severe headache. °· You have blurred vision or see spots. °· You feel sad or depressed. °· You have thoughts of hurting yourself or your newborn. °· You have questions about your care, the care of your newborn, or medicines. °· You are dizzy or light-headed. °· You have a rash. °· You have nausea or vomiting. °· You were breastfeeding and have not had a menstrual period within 12 weeks after you stopped breastfeeding. °· You are not breastfeeding and have not had a menstrual period by the 12th week after delivery. °· You have a fever. °SEEK IMMEDIATE MEDICAL CARE IF:  °· You have persistent pain. °· You have chest pain. °· You have shortness of breath. °· You faint. °· You   have leg pain.  You have stomach pain.  Your vaginal bleeding saturates two or more sanitary pads in 1 hour.   This information is not intended to replace advice given to you by your health care provider. Make sure you discuss any questions you have with your health care provider.   Document Released: 12/03/2000 Document Revised:  08/27/2015 Document Reviewed: 08/02/2012 Elsevier Interactive Patient Education 2016 [employer] Inc[employer]   Laparoscopic Tubal Ligation Laparoscopic tubal ligation is a procedure that closes the fallopian tubes at a time other than right after childbirth. When the fallopian tubes are closed, the eggs that are released from the ovaries cannot enter the uterus, and sperm cannot reach the egg. Tubal ligation is also known as getting your "tubes tied." Tubal ligation is done so you will not be able to get pregnant or have a baby. Although this procedure may be undone (reversed), it should be considered permanent and irreversible. If you want to have future pregnancies, you should not have this procedure. LET [facility] CARE PROVIDER KNOW ABOUT:  Any allergies you have.  All medicines you are taking, including vitamins, herbs, eye drops, creams, and over-the-counter medicines. This includes any use of steroids, either by mouth or in cream form.  Previous problems you or members of your family have had with the use of anesthetics.  Any blood disorders you have.  Previous surgeries you have had.  Any medical conditions you may have.  Possibility of pregnancy, if this applies.  Any past pregnancies. RISKS AND COMPLICATIONS  Infection.  Bleeding.  Injury to surrounding organs.  Side effects from anesthetics.  Failure of the procedure.  Ectopic pregnancy.  Future regret about having the procedure done. BEFORE THE PROCEDURE  Ask your health care provider about:  Changing or stopping your regular medicines. This is especially important if you are taking diabetes medicines or blood thinners.  Taking medicines such as aspirin and ibuprofen. These medicines can thin your blood. Do not take these medicines before your procedure if your health care provider instructs you not to.  Follow instructions from your health care provider about eating and drinking restrictions.  Plan to have  someone take you home after the procedure.  If you go home right after the procedure, plan to have someone with you for 24 hours. PROCEDURE  You will be given one or more of the following:  A medicine that helps you relax (sedative).  A medicine that numbs the area (local anesthetic).  A medicine that makes you fall asleep (general anesthetic).  A medicine that is injected into an area of your body that numbs everything below the injection site (regional anesthetic).  If you have been given general anesthetic, a tube will be put down your throat to help you breathe.  Two small cuts (incisions) will be made in the lower abdominal area and near the belly button.  Your bladder may be emptied with a small tube (catheter).  Your abdomen will be inflated with a safe gas (carbon dioxide). This will help to give the surgeon room to operate and visualize, and it will help the surgeon to avoid other organs.  A thin, lighted tube (laparoscope) with a camera attached will be inserted into your abdomen through one of the incisions near the belly button. Other small instruments will be inserted through the other abdominal incision.  The fallopian tubes will be tied off or burned (cauterized), or they will be blocked with a clip, ring, or clamp. In many cases,  a small portion in the center of each fallopian tube will also be removed.  After the fallopian tubes are blocked, the gas will be released from the abdomen.  The incisions will be closed with stitches (sutures).  A bandage (dressing) will be placed over the incisions. The procedure may vary among health care providers and hospitals. AFTER THE PROCEDURE  Your blood pressure, heart rate, breathing rate, and blood oxygen level will be monitored often until the medicines you were given have worn off.  You will be given pain medicine as needed.  If you had general anesthetic, you may have some mild discomfort in your throat. This is from  the breathing tube that was placed in your throat while you were sleeping.  You may experience discomfort in the shoulder area from some trapped air between your liver and your diaphragm. This sensation is normal, and it will slowly go away on its own.  You will have some mild abdominal discomfort for 3--7 days.   This information is not intended to replace advice given to you by your health care provider. Make sure you discuss any questions you have with your health care provider.   Document Released: 03/14/2001 Document Revised: 04/22/2015 Document Reviewed: 03/18/2012 Elsevier Interactive Patient Education 2016 [employer].  Hypertension During Pregnancy Hypertension, or high blood pressure, is when there is extra pressure inside your blood vessels that carry blood from the heart to the rest of your body (arteries). It can happen at any time in life, including pregnancy. Hypertension during pregnancy can cause problems for you and your baby. Your baby might not weigh as much as he or she should at birth or might be born early (premature). Very bad cases of hypertension during pregnancy can be life-threatening.  Different types of hypertension can occur during pregnancy. These include:  Chronic hypertension. This happens when a woman has hypertension before pregnancy and it continues during pregnancy.  Gestational hypertension. This is when hypertension develops during pregnancy.  Preeclampsia or toxemia of pregnancy. This is a very serious type of hypertension that develops only during pregnancy. It affects the whole body and can be very dangerous for both mother and baby.  Gestational hypertension and preeclampsia usually go away after your baby is born. Your blood pressure will likely stabilize within 6 weeks. Women who have hypertension during pregnancy have a greater chance of developing hypertension later in life or with future pregnancies. RISK FACTORS There are certain factors that  make it more likely for you to develop hypertension during pregnancy. These include:  Having hypertension before pregnancy.  Having hypertension during a previous pregnancy.  Being overweight.  Being older than 40 years.  Being pregnant with more than one baby.  Having diabetes or kidney problems. SIGNS AND SYMPTOMS Chronic and gestational hypertension rarely cause symptoms. Preeclampsia has symptoms, which may include:  Increased protein in your urine. Your health care provider will check for this at every prenatal visit.  Swelling of your hands and face.  Rapid weight gain.  Headaches.  Visual changes.  Being bothered by light.  Abdominal pain, especially in the upper right area.  Chest pain.  Shortness of breath.  Increased reflexes.  Seizures. These occur with a more severe form of preeclampsia, called eclampsia. DIAGNOSIS  You may be diagnosed with hypertension during a regular prenatal exam. At each prenatal visit, you may have:  Your blood pressure checked.  A urine test to check for protein in your urine. The type of hypertension you  are diagnosed with depends on when you developed it. It also depends on your specific blood pressure reading.  Developing hypertension before 20 weeks of pregnancy is consistent with chronic hypertension.  Developing hypertension after 20 weeks of pregnancy is consistent with gestational hypertension.  Hypertension with increased urinary protein is diagnosed as preeclampsia.  Blood pressure measurements that stay above 160 systolic or 110 diastolic are a sign of severe preeclampsia. TREATMENT Treatment for hypertension during pregnancy varies. Treatment depends on the type of hypertension and how serious it is.  If you take medicine for chronic hypertension, you may need to switch medicines.  Medicines called ACE inhibitors should not be taken during pregnancy.  Low-dose aspirin may be suggested for women who have risk  factors for preeclampsia.  If you have gestational hypertension, you may need to take a blood pressure medicine that is safe during pregnancy. Your health care provider will recommend the correct medicine.  If you have severe preeclampsia, you may need to be in the hospital. Health care providers will watch you and your baby very closely. You also may need to take medicine called magnesium sulfate to prevent seizures and lower blood pressure.  Sometimes, an early delivery is needed. This may be the case if the condition worsens. It would be done to protect you and your baby. The only cure for preeclampsia is delivery.  Your health care provider may recommend that you take one low-dose aspirin (81 mg) each day to help prevent high blood pressure during your pregnancy if you are at risk for preeclampsia. You may be at risk for preeclampsia if:  You had preeclampsia or eclampsia during a previous pregnancy.  Your baby did not grow as expected during a previous pregnancy.  You experienced preterm birth with a previous pregnancy.  You experienced a separation of the placenta from the uterus (placental abruption) during a previous pregnancy.  You experienced the loss of your baby during a previous pregnancy.  You are pregnant with more than one baby.  You have other medical conditions, such as diabetes or an autoimmune disease. HOME CARE INSTRUCTIONS  Schedule and keep all of your regular prenatal care appointments. This is important.  Take medicines only as directed by your health care provider. Tell your health care provider about all medicines you take.  Eat as little salt as possible.  Get regular exercise.  Do not drink alcohol.  Do not use tobacco products.  Do not drink products with caffeine.  Lie on your left side when resting. SEEK IMMEDIATE MEDICAL CARE IF:  You have severe abdominal pain.  You have sudden swelling in your hands, ankles, or face.  You gain 4 pounds  (1.8 kg) or more in 1 week.  You vomit repeatedly.  You have vaginal bleeding.  You do not feel your baby moving as much.  You have a headache.  You have blurred or double vision.  You have muscle twitching or spasms.  You have shortness of breath.  You have blue fingernails or lips.  You have blood in your urine. MAKE SURE YOU:  Understand these instructions.  Will watch your condition.  Will get help right away if you are not doing well or get worse.   This information is not intended to replace advice given to you by your health care provider. Make sure you discuss any questions you have with your health care provider.   Document Released: 08/24/2011 Document Revised: 12/27/2014 Document Reviewed: 07/05/2013 Elsevier Interactive Patient Education 2016  Elsevier Inc. ° °

## 2016-10-29 ENCOUNTER — Encounter: Payer: Medicaid Other | Admitting: Obstetrics and Gynecology

## 2016-11-18 ENCOUNTER — Ambulatory Visit: Payer: Medicaid Other | Admitting: Advanced Practice Midwife

## 2017-10-07 ENCOUNTER — Emergency Department (HOSPITAL_COMMUNITY): Admission: EM | Admit: 2017-10-07 | Discharge: 2017-10-07 | Discharge status: Against Medical Advice | Payer: Self-pay

## 2017-10-07 NOTE — ED Notes (Signed)
Stickers left on counter at registration. Pt believed to have left.

## 2017-10-15 ENCOUNTER — Emergency Department (HOSPITAL_COMMUNITY)
Admission: EM | Admit: 2017-10-15 | Discharge: 2017-10-16 | Disposition: A | Discharge status: Home / Self Care | Payer: Medicaid Other | Attending: Emergency Medicine | Admitting: Emergency Medicine

## 2017-10-15 DIAGNOSIS — M25512 Pain in left shoulder: Secondary | ICD-10-CM | POA: Diagnosis present

## 2017-10-15 DIAGNOSIS — M5412 Radiculopathy, cervical region: Secondary | ICD-10-CM

## 2017-10-15 DIAGNOSIS — Z79899 Other long term (current) drug therapy: Secondary | ICD-10-CM | POA: Diagnosis not present

## 2017-10-15 DIAGNOSIS — F17210 Nicotine dependence, cigarettes, uncomplicated: Secondary | ICD-10-CM | POA: Insufficient documentation

## 2017-10-15 MED ORDER — METHOCARBAMOL 500 MG PO TABS: ORAL_TABLET | ORAL | 0 refills | Status: AC

## 2017-10-15 MED ORDER — PREDNISONE 50 MG PO TABS: ORAL_TABLET | ORAL | 0 refills | Status: AC

## 2017-10-15 NOTE — ED Triage Notes (Signed)
Per pt, states 2 weeks ago she woke up with left sided neck pain-since it has been gradually radiating down left shoulder-numbness-states it feels heavy-no relief from OTC meds

## 2017-10-15 NOTE — Discharge Instructions (Signed)
Only use the arm sling for up to 2 days. Take the arm out and rotate the shoulder every 4 hours.   You can also take  tylenol (acetaminophen) 975mg  (this is 3 over the counter pills) four times a day. Do not drink alcohol or combine with other medications that have acetaminophen as an ingredient (Read the labels!).    For breakthrough pain you may take Robaxin. Do not drink alcohol, drive or operate heavy machinery when taking Robaxin.   Do not hesitate to return to the emergency room for any new, worsening or concerning symptoms.  Please obtain primary care using resource guide below. Let them know that you were seen in the emergency room and that they will need to obtain records for further outpatient management.

## 2017-10-15 NOTE — ED Provider Notes (Signed)
[facility]-EMERGENCY DEPT Provider Note   CSN: [id number] Arrival date & time: 10/15/17  1525     History   Chief Complaint Chief Complaint  Patient presents with  . Shoulder Pain    HPI   Blood pressure (!) 133/98, pulse (!) 104, temperature 98.1 F (36.7 C), temperature source Oral, resp. rate 18, last menstrual period 09/24/2017, SpO2 100 %, unknown if currently breastfeeding.  Laurie Morales is a 36 y.o. female complaining of 7 out of 10 posterior left shoulder pain radiating down the left arm, initially it began when she slept with her son in her arms 2 weeks ago.  She has been taking ibuprofen, a friend's muscle relaxer at home with little relief.  She is right-hand dominant.  There is been no trauma.  She denies any weakness or decreased sensation.  She endorses a pins and needles paresthesia, denies neck pain, fever, chills, history of IV drug use.  Past Medical History:  Diagnosis Date  . Abscess   . Medical history non-contributory   . No pertinent past medical history     Patient Active Problem List   Diagnosis Date Noted  . Gestational hypertension 10/12/2016  . Abscess of right breast 07/18/2012  . Tobacco abuse 07/18/2012    Past Surgical History:  Procedure Laterality Date  . DILATION AND CURETTAGE OF UTERUS      OB History    Gravida Para Term Preterm AB Living   11 7 7  0 4 7   SAB TAB Ectopic Multiple Live Births   2 2 0 0 7       Home Medications    Prior to Admission medications   Medication Sig Start Date End Date Taking? Authorizing Provider  methocarbamol (ROBAXIN) 500 MG tablet Can take up to 1-2 tabs every 6 hours PRN PAIN 10/15/17   Taqwa Deem, Joni Reining, PA-C  predniSONE (DELTASONE) 50 MG tablet Take 1 tablet daily with breakfast 10/15/17   Jasson Siegmann, Joni Reining, PA-C  Prenatal Vit-Fe Fumarate-FA (PRENATAL MULTIVITAMIN) TABS tablet Take 1 tablet by mouth daily at 12 noon. 10/12/16   Dorathy Kinsman, CNM    Family  History Family History  Problem Relation Age of Onset  . Diabetes Mother   . Asthma Daughter   . Diabetes Maternal Grandmother   . Stroke Maternal Grandmother   . Heart disease Maternal Grandmother   . Hearing loss Maternal Grandmother   . Anesthesia problems Neg Hx     Social History Social History  Substance Use Topics  . Smoking status: Current Every Day Smoker    Packs/day: 0.25    Types: Cigarettes  . Smokeless tobacco: Never Used  . Alcohol use No     Comment: socially     Allergies   Patient has no known allergies.   Review of Systems Review of Systems  A complete review of systems was obtained and all systems are negative except as noted in the HPI and PMH.   Physical Exam Updated Vital Signs BP (!) 133/98 (BP Location: Left Arm)   Pulse (!) 104   Temp 98.1 F (36.7 C) (Oral)   Resp 18   LMP 09/24/2017   SpO2 100%   Physical Exam  Constitutional: She is oriented to person, place, and time. She appears well-developed and well-nourished. No distress.  HENT:  Head: Normocephalic and atraumatic.  Mouth/Throat: Oropharynx is clear and moist.  Eyes: Pupils are equal, round, and reactive to light. Conjunctivae and EOM are normal.  Neck: Normal range  of motion.  No midline C-spine  tenderness to palpation or step-offs appreciated. Patient has full range of motion without pain.  Grip strength, biceps, triceps 5/5 bilaterally;  can differentiate between pinprick and light touch bilaterally.  Spurling test positive on the right, negative on the left.   Cardiovascular: Normal rate, regular rhythm and intact distal pulses.   Pulmonary/Chest: Effort normal and breath sounds normal.  Abdominal: Soft. There is no tenderness.  Musculoskeletal: Normal range of motion.  Neurological: She is alert and oriented to person, place, and time.  II-Visual fields grossly intact. III/IV/VI-Extraocular movements intact.  Pupils reactive bilaterally. V/VII-Smile symmetric,  equal eyebrow raise,  facial sensation intact VIII- Hearing grossly intact IX/X-Normal gag XI-bilateral shoulder shrug XII-midline tongue extension Ambulates with a coordinated gait   Skin: She is not diaphoretic.  Psychiatric: She has a normal mood and affect.  Nursing note and vitals reviewed.    ED Treatments / Results  Labs (all labs ordered are listed, but only abnormal results are displayed) Labs Reviewed - No data to display  EKG  EKG Interpretation None       Radiology No results found.  Procedures Procedures (including critical care time)  Medications Ordered in ED Medications - No data to display   Initial Impression / Assessment and Plan / ED Course  I have reviewed the triage vital signs and the nursing notes.  Pertinent labs & imaging results that were available during my care of the patient were reviewed by me and considered in my medical decision making (see chart for details).     Vitals:   10/15/17 1533  BP: (!) 133/98  Pulse: (!) 104  Resp: 18  Temp: 98.1 F (36.7 C)  TempSrc: Oral  SpO2: 100%    Medications - No data to display  Earline MayotteYolanda S Screws is 36 y.o. female presenting with left shoulder pain and paresthesia, most consistent with a peripheral pinched nerve.  Neurologic exam reassuring, I doubt there is a intracervical process that needs emergent intervention.  The patient will be started on prednisone, given sling for comfort.  Advised her to follow closely with primary care.  Evaluation does not show pathology that would require ongoing emergent intervention or inpatient treatment. Pt is hemodynamically stable and mentating appropriately. Discussed findings and plan with patient/guardian, who agrees with care plan. All questions answered. Return precautions discussed and outpatient follow up given.      Final Clinical Impressions(s) / ED Diagnoses   Final diagnoses:  Cervical radiculopathy    New Prescriptions New  Prescriptions   METHOCARBAMOL (ROBAXIN) 500 MG TABLET    Can take up to 1-2 tabs every 6 hours PRN PAIN   PREDNISONE (DELTASONE) 50 MG TABLET    Take 1 tablet daily with breakfast     Wynetta Emeryisciotta, Claudia Alvizo, PA-C 10/15/17 1558    Gwyneth SproutPlunkett, Whitney, MD 10/17/17 1629

## 2019-05-03 ENCOUNTER — Encounter: Payer: Self-pay | Admitting: *Deleted
